# Patient Record
Sex: Male | Born: 2000 | Race: Black or African American | Hispanic: No | Marital: Single | Smoking: Never smoker
Health system: Southern US, Community
[De-identification: ages and names within clinical notes are randomized; demographics above are authoritative.]

## PROBLEM LIST (undated history)

## (undated) DIAGNOSIS — J020 Streptococcal pharyngitis: Secondary | ICD-10-CM

## (undated) DIAGNOSIS — J45909 Unspecified asthma, uncomplicated: Secondary | ICD-10-CM

## (undated) HISTORY — PX: SHOULDER SURGERY: SHX246

---
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
## 2019-02-25 DIAGNOSIS — Z20828 Contact with and (suspected) exposure to other viral communicable diseases: Secondary | ICD-10-CM | POA: Diagnosis not present

## 2020-05-31 ENCOUNTER — Emergency Department (HOSPITAL_BASED_OUTPATIENT_CLINIC_OR_DEPARTMENT_OTHER): Payer: BC Managed Care – PPO

## 2020-05-31 ENCOUNTER — Encounter (HOSPITAL_BASED_OUTPATIENT_CLINIC_OR_DEPARTMENT_OTHER): Payer: Self-pay | Admitting: Emergency Medicine

## 2020-05-31 ENCOUNTER — Emergency Department (HOSPITAL_BASED_OUTPATIENT_CLINIC_OR_DEPARTMENT_OTHER)
Admission: EM | Admit: 2020-05-31 | Discharge: 2020-05-31 | Disposition: A | Discharge status: Home / Self Care | Payer: BC Managed Care – PPO | Attending: Emergency Medicine | Admitting: Emergency Medicine

## 2020-05-31 ENCOUNTER — Other Ambulatory Visit: Payer: Self-pay

## 2020-05-31 DIAGNOSIS — Z20822 Contact with and (suspected) exposure to covid-19: Secondary | ICD-10-CM

## 2020-05-31 DIAGNOSIS — R0602 Shortness of breath: Secondary | ICD-10-CM | POA: Diagnosis not present

## 2020-05-31 DIAGNOSIS — J45909 Unspecified asthma, uncomplicated: Secondary | ICD-10-CM | POA: Insufficient documentation

## 2020-05-31 DIAGNOSIS — U071 COVID-19: Secondary | ICD-10-CM | POA: Insufficient documentation

## 2020-05-31 DIAGNOSIS — R059 Cough, unspecified: Secondary | ICD-10-CM | POA: Diagnosis not present

## 2020-05-31 DIAGNOSIS — J90 Pleural effusion, not elsewhere classified: Secondary | ICD-10-CM | POA: Diagnosis not present

## 2020-05-31 HISTORY — DX: Unspecified asthma, uncomplicated: J45.909

## 2020-05-31 LAB — RESP PANEL BY RT-PCR (FLU A&B, COVID) ARPGX2
Influenza A by PCR: NEGATIVE
Influenza B by PCR: NEGATIVE
SARS Coronavirus 2 by RT PCR: POSITIVE — AB

## 2020-05-31 MED ORDER — BENZONATATE 100 MG PO CAPS: 100.0000 mg | ORAL_CAPSULE | Freq: Three times a day (TID) | ORAL | 0 refills | Status: AC

## 2020-05-31 MED ORDER — ALBUTEROL SULFATE HFA 108 (90 BASE) MCG/ACT IN AERS
2.0000 | INHALATION_SPRAY | Freq: Once | RESPIRATORY_TRACT | Status: AC
  Administered 2020-05-31: 2 via RESPIRATORY_TRACT
  Filled 2020-05-31: qty 6.7

## 2020-05-31 NOTE — ED Provider Notes (Signed)
[facility] EMERGENCY DEPARTMENT Provider Note   CSN: [id number] Arrival date & time: 05/31/20  1025     History Chief Complaint  Patient presents with  . Cough    Jonathan Harris is a 19 y.o. male with PMHx asthma who presents to the ED today with complaint of dry cough that began last night. Pt also complains of shortness of breath, headache, and diarrhea. He states he was around his father 2 days ago prior to his father testing positive for COVID. Pt is unvaccinated. Pt has not taken anything for his symptoms. He denies fevers, chills, chest pain, abdominal pain, nausea, vomiting, sore throat, eat pain, or any other associated symptoms.   The history is provided by the patient and medical records.       Past Medical History:  Diagnosis Date  . Asthma     There are no problems to display for this patient.   Past Surgical History:  Procedure Laterality Date  . SHOULDER SURGERY Left        No family history on file.  Social History   Tobacco Use  . Smoking status: Never Smoker  . Smokeless tobacco: Never Used  Substance Use Topics  . Alcohol use: Never  . Drug use: Never    Home Medications Prior to Admission medications   Medication Sig Start Date End Date Taking? Authorizing Provider  benzonatate (TESSALON) 100 MG capsule Take 1 capsule (100 mg total) by mouth every 8 (eight) hours. 05/31/20   Tanda Rockers, PA-C    Allergies    Patient has no known allergies.  Review of Systems   Review of Systems  Constitutional: Negative for chills and fever.  HENT: Negative for sore throat.   Respiratory: Positive for cough and shortness of breath.   Cardiovascular: Negative for chest pain.  Gastrointestinal: Positive for diarrhea. Negative for abdominal pain, nausea and vomiting.  Neurological: Positive for headaches.    Physical Exam Updated Vital Signs BP 126/75 (BP Location: Right Arm)   Pulse 60   Temp 98.5 F (36.9 C) (Oral)   Resp 18    Ht 5\' 10"  (1.778 m)   Wt 70.3 kg   SpO2 100%   BMI 22.24 kg/m   Physical Exam Vitals and nursing note reviewed.  Constitutional:      Appearance: He is not ill-appearing or diaphoretic.  HENT:     Head: Normocephalic and atraumatic.  Eyes:     Conjunctiva/sclera: Conjunctivae normal.  Cardiovascular:     Rate and Rhythm: Normal rate and regular rhythm.  Pulmonary:     Effort: Pulmonary effort is normal.     Breath sounds: Normal breath sounds. No wheezing, rhonchi or rales.     Comments: Actively coughing in the room however able to speak in full sentences without difficulty. Satting 100% on RA at rest. Pt personally ambulated by myself with O2 sats remaining at 98%. LCTAB. Abdominal:     Tenderness: There is no abdominal tenderness. There is no guarding or rebound.  Skin:    General: Skin is warm and dry.     Coloration: Skin is not jaundiced.  Neurological:     Mental Status: He is alert.     ED Results / Procedures / Treatments   Labs (all labs ordered are listed, but only abnormal results are displayed) Labs Reviewed  RESP PANEL BY RT-PCR (FLU A&B, COVID) ARPGX2    EKG EKG Interpretation  Date/Time:  Thursday May 31 2020 10:38:35 EST Ventricular Rate:  61 PR Interval:    QRS Duration: 95 QT Interval:  375 QTC Calculation: 378 R Axis:   67 Text Interpretation: Sinus rhythm No prior ECG for comparison. No STEMI Confirmed by Theda Belfast ([id number]) on 05/31/2020 10:42:43 AM   Radiology DG Chest Port 1 View  Result Date: 05/31/2020 CLINICAL DATA:  Cough EXAM: PORTABLE CHEST 1 VIEW COMPARISON:  None. FINDINGS: The cardiomediastinal silhouette is normal in contour. No pleural effusion. No pneumothorax. No acute pleuroparenchymal abnormality. Visualized abdomen is unremarkable. No acute osseous abnormality noted. IMPRESSION: No acute cardiopulmonary abnormality. Electronically Signed   By: Meda Klinefelter MD   On: 05/31/2020 11:33     Procedures Procedures (including critical care time)  Medications Ordered in ED Medications  albuterol (VENTOLIN HFA) 108 (90 Base) MCG/ACT inhaler 2 puff (2 puffs Inhalation Given 05/31/20 1125)    ED Course  I have reviewed the triage vital signs and the nursing notes.  Pertinent labs & imaging results that were available during my care of the patient were reviewed by me and considered in my medical decision making (see chart for details).    MDM Rules/Calculators/A&P                          19 year old male presenting to the ED today with complaint of cough, SOB, headache, diarrhea that began last night after recent positive exposure to COVID positive patient. Pt is unvaccinated. On arrival to the ED VSS. Pt is afebrile, nontachycardic, and nontachypneic. He appears to be in NAD. An EKG was obtained prior to being seen at pt complains of SOB however on my exam he is able to speak in full sentences without difficulty, satting 100% on RA at rest and 98% on RA at ambulation. EKG without acute ischemic changes. Will plan for CXR and COVID swab at this time. Will provide albuterol inhaler for patient for symptomatic treatment. If CXR negative will plan to discharge and have pt await his covid test results however I have a strong suspicion they will be positive.   CXR clear. Will discharge home at this time and have pt await results. He is in agreement with plan today and stable for discharge.   This note was prepared using Dragon voice recognition software and may include unintentional dictation errors due to the inherent limitations of voice recognition software.  [name] was evaluated in Emergency Department on 05/31/2020 for the symptoms described in the history of present illness. He was evaluated in the context of the global COVID-19 pandemic, which necessitated consideration that the patient might be at risk for infection with the SARS-CoV-2 virus that causes COVID-19.  Institutional protocols and algorithms that pertain to the evaluation of patients at risk for COVID-19 are in a state of rapid change based on information released by regulatory bodies including the CDC and federal and state organizations. These policies and algorithms were followed during the patient's care in the ED.   Final Clinical Impression(s) / ED Diagnoses Final diagnoses:  Person under investigation for COVID-19  Cough    Rx / DC Orders ED Discharge Orders         Ordered    benzonatate (TESSALON) 100 MG capsule  Every 8 hours        05/31/20 1140           Discharge Instructions     Your chest xray did not show any signs of infection today. We have swabbed you for  COVID - please await your results. We will call you if you test positive. If positive you will need to self isolate for 14 days (cleared: 06/15/2020). If negative I would still recommend you self isolate given your recent positive exposure with your father.   Drink plenty of water to stay hydrated. Take Tylenol as needed for fevers > 100.4, Take Ibuprofen as needed for headaches. Use the albuterol inhaler as needed for shortness of breath. I have also prescribed cough medication for you to take as prescribed.   Follow up with your PCP regarding your ED visit today  Return to the ED IMMEDIATELY for any worsening symptoms including worsening shortness of breath, passing out, severe chest pain, fingers/lips turning blue, or any other new/concerning symptoms.        Tanda Rockers, PA-C 05/31/20 1145    Tegeler, Canary Brim, MD 05/31/20 [phone]

## 2020-05-31 NOTE — ED Notes (Signed)
ED Provider at bedside. 

## 2020-05-31 NOTE — ED Triage Notes (Signed)
States," Since last night I have had cough, SOB, h/a" Father dx'ed with COVID x 2 days ago

## 2020-05-31 NOTE — Discharge Instructions (Addendum)
Your chest xray did not show any signs of infection today. We have swabbed you for COVID - please await your results. We will call you if you test positive. If positive you will need to self isolate for 14 days (cleared: 06/15/2020). If negative I would still recommend you self isolate given your recent positive exposure with your father.   Drink plenty of water to stay hydrated. Take Tylenol as needed for fevers > 100.4, Take Ibuprofen as needed for headaches. Use the albuterol inhaler as needed for shortness of breath. I have also prescribed cough medication for you to take as prescribed.   Follow up with your PCP regarding your ED visit today  Return to the ED IMMEDIATELY for any worsening symptoms including worsening shortness of breath, passing out, severe chest pain, fingers/lips turning blue, or any other new/concerning symptoms.

## 2020-06-01 ENCOUNTER — Telehealth: Payer: Self-pay | Admitting: Physician Assistant

## 2020-06-01 NOTE — Telephone Encounter (Signed)
Called to discuss with patient about Covid symptoms and the use of sotrovimab, bamlanivimab/etesevimab or casirivimab/imdevimab, a monoclonal antibody infusion for those with mild to moderate Covid symptoms and at a high risk of hospitalization.  Pt is qualified for this infusion at the [facility] due to; Specific high risk criteria : Other high risk medical condition per CDC:  increased SVI   His primary listed number was answered by another woman who said I had the wrong number and hung up .  Cline Crock PA-C

## 2021-05-06 ENCOUNTER — Other Ambulatory Visit: Payer: Self-pay

## 2021-05-06 ENCOUNTER — Emergency Department (HOSPITAL_BASED_OUTPATIENT_CLINIC_OR_DEPARTMENT_OTHER)
Admission: EM | Admit: 2021-05-06 | Discharge: 2021-05-06 | Disposition: A | Discharge status: Home / Self Care | Payer: BC Managed Care – PPO | Attending: Emergency Medicine | Admitting: Emergency Medicine

## 2021-05-06 DIAGNOSIS — J069 Acute upper respiratory infection, unspecified: Secondary | ICD-10-CM | POA: Insufficient documentation

## 2021-05-06 DIAGNOSIS — B9789 Other viral agents as the cause of diseases classified elsewhere: Secondary | ICD-10-CM | POA: Diagnosis not present

## 2021-05-06 DIAGNOSIS — Z20822 Contact with and (suspected) exposure to covid-19: Secondary | ICD-10-CM | POA: Insufficient documentation

## 2021-05-06 DIAGNOSIS — J45909 Unspecified asthma, uncomplicated: Secondary | ICD-10-CM | POA: Insufficient documentation

## 2021-05-06 DIAGNOSIS — R0981 Nasal congestion: Secondary | ICD-10-CM | POA: Diagnosis not present

## 2021-05-06 LAB — RESP PANEL BY RT-PCR (FLU A&B, COVID) ARPGX2
Influenza A by PCR: NEGATIVE
Influenza B by PCR: NEGATIVE
SARS Coronavirus 2 by RT PCR: NEGATIVE

## 2021-05-06 NOTE — ED Provider Notes (Signed)
[facility] EMERGENCY DEPARTMENT Provider Note   CSN: [id number] Arrival date & time: 05/06/21  1405     History Chief Complaint  Patient presents with   Nasal Congestion    Jonathan Harris is a 20 y.o. male presenting today requesting an RSV test.  He was around his little cousin who tested positive for RSV last week.  Over the weekend he began to have fevers, chills and congestion.  Denies any difficulty breathing or chest pain.  Has been using Mucinex over-the-counter which he feels has helped some.   Past Medical History:  Diagnosis Date   Asthma     There are no problems to display for this patient.   Past Surgical History:  Procedure Laterality Date   SHOULDER SURGERY Left        No family history on file.  Social History   Tobacco Use   Smoking status: Never   Smokeless tobacco: Never  Substance Use Topics   Alcohol use: Never   Drug use: Never    Home Medications Prior to Admission medications   Medication Sig Start Date End Date Taking? Authorizing Provider  benzonatate (TESSALON) 100 MG capsule Take 1 capsule (100 mg total) by mouth every 8 (eight) hours. 05/31/20   Tanda Rockers, PA-C    Allergies    Patient has no known allergies.  Review of Systems   Review of Systems  HENT:  Positive for congestion.   Musculoskeletal:  Positive for myalgias.  Neurological:  Positive for headaches.   Physical Exam Updated Vital Signs BP 96/64 (BP Location: Right Arm)   Pulse 74   Temp 98.2 F (36.8 C) (Oral)   Resp 18   Ht 5\' 10"  (1.778 m)   Wt 68 kg   SpO2 97%   BMI 21.52 kg/m   Physical Exam Vitals and nursing note reviewed.  Constitutional:      Appearance: Normal appearance.  HENT:     Head: Normocephalic and atraumatic.     Right Ear: Tympanic membrane normal.     Left Ear: Tympanic membrane normal.     Nose: Nose normal.     Mouth/Throat:     Mouth: Mucous membranes are moist.     Pharynx: Oropharynx is clear.  Eyes:      General: No scleral icterus.    Conjunctiva/sclera: Conjunctivae normal.  Cardiovascular:     Rate and Rhythm: Normal rate and regular rhythm.  Pulmonary:     Effort: Pulmonary effort is normal. No respiratory distress.     Breath sounds: No wheezing or rales.  Abdominal:     General: Abdomen is flat.     Palpations: Abdomen is soft.     Tenderness: There is no abdominal tenderness.  Musculoskeletal:     Cervical back: Normal range of motion.  Skin:    General: Skin is warm and dry.     Findings: No rash.  Neurological:     Mental Status: He is alert.  Psychiatric:        Mood and Affect: Mood normal.        Behavior: Behavior normal.    ED Results / Procedures / Treatments   Labs (all labs ordered are listed, but only abnormal results are displayed) Labs Reviewed  RESP PANEL BY RT-PCR (FLU A&B, COVID) ARPGX2    EKG None  Radiology No results found.  Procedures Procedures   Medications Ordered in ED Medications - No data to display  ED Course  I have reviewed  the triage vital signs and the nursing notes.  Pertinent labs & imaging results that were available during my care of the patient were reviewed by me and considered in my medical decision making (see chart for details).    MDM Rules/Calculators/A&P Patient was evaluated by me and fast-track.  He was resting comfortably in no acute distress.  We discussed that our test do not include RSV for adults however symptoms are similar with COVID and the flu.  These test will not come back today and he is okay to look for the results on his portal or be contacted by telephone.  Agreeable, ambulatory and stable for discharge at this time.  Final Clinical Impression(s) / ED Diagnoses Final diagnoses:  Viral upper respiratory tract infection    Rx / DC Orders Results and diagnoses were explained to the patient. Return precautions discussed in full. Patient had no additional questions and expressed complete  understanding.     Woodroe Chen 05/06/21 1654    Glendora Score, MD 05/06/21 [phone]

## 2021-05-06 NOTE — ED Triage Notes (Signed)
Pt reports congestion, headache, body aches for last several days. Pt reports intermittent fever with no relief of symptoms with otc mucinex. Pt reports niece tested positive for RSV recently and would like to be tested.

## 2021-05-06 NOTE — Discharge Instructions (Addendum)
Your COVID and flu testing will not come back today.  Please look out for these results in your chart or you can expect a phone call if you test positive.  It was a pleasure to meet you and I hope that you feel better!

## 2021-07-07 ENCOUNTER — Emergency Department (HOSPITAL_BASED_OUTPATIENT_CLINIC_OR_DEPARTMENT_OTHER)
Admission: EM | Admit: 2021-07-07 | Discharge: 2021-07-07 | Disposition: A | Discharge status: Home / Self Care | Payer: BC Managed Care – PPO | Attending: Emergency Medicine | Admitting: Emergency Medicine

## 2021-07-07 ENCOUNTER — Other Ambulatory Visit: Payer: Self-pay

## 2021-07-07 ENCOUNTER — Encounter (HOSPITAL_BASED_OUTPATIENT_CLINIC_OR_DEPARTMENT_OTHER): Payer: Self-pay | Admitting: Urology

## 2021-07-07 DIAGNOSIS — J029 Acute pharyngitis, unspecified: Secondary | ICD-10-CM | POA: Diagnosis present

## 2021-07-07 DIAGNOSIS — Z20822 Contact with and (suspected) exposure to covid-19: Secondary | ICD-10-CM | POA: Diagnosis not present

## 2021-07-07 LAB — RESP PANEL BY RT-PCR (FLU A&B, COVID) ARPGX2
Influenza A by PCR: NEGATIVE
Influenza B by PCR: NEGATIVE
SARS Coronavirus 2 by RT PCR: NEGATIVE

## 2021-07-07 LAB — GROUP A STREP BY PCR: Group A Strep by PCR: NOT DETECTED

## 2021-07-07 MED ORDER — CETIRIZINE HCL 10 MG PO TABS: 10.0000 mg | ORAL_TABLET | Freq: Every day | ORAL | 0 refills | Status: AC

## 2021-07-07 MED ORDER — ACETAMINOPHEN 325 MG PO TABS
650.0000 mg | ORAL_TABLET | Freq: Once | ORAL | Status: AC
  Administered 2021-07-07: 650 mg via ORAL
  Filled 2021-07-07 (×2): qty 2

## 2021-07-07 MED ORDER — FLUTICASONE PROPIONATE 50 MCG/ACT NA SUSP: 2.0000 | Freq: Every day | NASAL | 0 refills | Status: AC

## 2021-07-07 NOTE — ED Triage Notes (Signed)
Sore throat since Tuesday, states eye redness x 2 days Denies Fever/N/V

## 2021-07-07 NOTE — Discharge Instructions (Addendum)
Your testing was negative for COVID, influenza and strep Your symptoms are likely viral in origin.  Please drink plenty of water, warm tea and honey, take Tylenol and ibuprofen as discussed below.  Please use Tylenol or ibuprofen for pain.  You may use 600 mg ibuprofen every 6 hours or 1000 mg of Tylenol every 6 hours.  You may choose to alternate between the 2.  This would be most effective.  Not to exceed 4 g of Tylenol within 24 hours.  Not to exceed 3200 mg ibuprofen 24 hours.   Viral Illness TREATMENT  Treatment is directed at relieving symptoms. There is no cure. Antibiotics are not effective, because the infection is caused by a virus, not by bacteria. Treatment may include:  Increased fluid intake. Sports drinks offer valuable electrolytes, sugars, and fluids.  Breathing heated mist or steam (vaporizer or shower).  Eating chicken soup or other clear broths, and maintaining good nutrition.  Getting plenty of rest.  Using gargles or lozenges for comfort.  Increasing usage of your inhaler if you have asthma.  Return to work when your temperature has returned to normal.  Gargle warm salt water and spit it out for sore throat. Take benadryl to decrease sinus secretions. Continue to alternate between Tylenol and ibuprofen for pain and fever control.  Follow Up: Follow up with your primary care doctor in 5-7 days for recheck of ongoing symptoms.  Return to emergency department for emergent changing or worsening of symptoms.

## 2021-07-07 NOTE — ED Provider Notes (Signed)
[facility] EMERGENCY DEPARTMENT Provider Note   CSN: [id number] Arrival date & time: 07/07/21  2109     History  Chief Complaint  Patient presents with   Sore Throat    Jonathan Harris is a 21 y.o. male.   Sore Throat Pertinent negatives include no chest pain, no headaches and no shortness of breath.  Patient is a 21 year old male   He is presented emergency room today with 2 days of fatigue, sore throat, cough congestion no other significant associated symptoms.  States that his sore throat is mild and achy has not take any medications for it.  He has not had any sick contacts that he knows of.  No chest pain or difficulty breathing.  No aggravating or mitigating factors.     Home Medications Prior to Admission medications   Medication Sig Start Date End Date Taking? Authorizing Provider  cetirizine (ZYRTEC ALLERGY) 10 MG tablet Take 1 tablet (10 mg total) by mouth daily. 07/07/21  Yes Benecio Kluger S, PA  fluticasone (FLONASE) 50 MCG/ACT nasal spray Place 2 sprays into both nostrils daily for 14 days. 07/07/21 07/21/21 Yes Genavie Boettger S, PA  benzonatate (TESSALON) 100 MG capsule Take 1 capsule (100 mg total) by mouth every 8 (eight) hours. 05/31/20   Tanda Rockers, PA-C      Allergies    Patient has no known allergies.    Review of Systems   Review of Systems  Constitutional:  Negative for fever.  HENT:  Positive for congestion and sore throat.   Respiratory:  Positive for cough. Negative for shortness of breath.   Cardiovascular:  Negative for chest pain.  Gastrointestinal:  Negative for abdominal distention.  Neurological:  Negative for dizziness and headaches.   Physical Exam Updated Vital Signs BP 106/77 (BP Location: Right Arm)    Pulse (!) 107    Temp 99.2 F (37.3 C) (Oral)    Resp 18    Ht 5\' 10"  (1.778 m)    Wt 70.3 kg    SpO2 99%    BMI 22.24 kg/m  Physical Exam Vitals and nursing note reviewed.  Constitutional:      General: He is not  in acute distress. HENT:     Head: Normocephalic and atraumatic.     Nose: Nose normal.     Mouth/Throat:     Comments: Uvula midline, normal phonation, no tonsillar hypertrophy or exudates Eyes:     General: No scleral icterus. Cardiovascular:     Rate and Rhythm: Normal rate and regular rhythm.     Pulses: Normal pulses.     Heart sounds: Normal heart sounds.  Pulmonary:     Effort: Pulmonary effort is normal. No respiratory distress.     Breath sounds: No wheezing.     Comments: No tachypnea, speaking full sentences, lungs are clear Abdominal:     Palpations: Abdomen is soft.     Tenderness: There is no abdominal tenderness.  Musculoskeletal:     Cervical back: Normal range of motion.     Right lower leg: No edema.     Left lower leg: No edema.  Skin:    General: Skin is warm and dry.     Capillary Refill: Capillary refill takes less than 2 seconds.  Neurological:     Mental Status: He is alert. Mental status is at baseline.  Psychiatric:        Mood and Affect: Mood normal.        Behavior: Behavior normal.  ED Results / Procedures / Treatments   Labs (all labs ordered are listed, but only abnormal results are displayed) Labs Reviewed  GROUP A STREP BY PCR  RESP PANEL BY RT-PCR (FLU A&B, COVID) ARPGX2    EKG None  Radiology No results found.  Procedures Procedures    Medications Ordered in ED Medications  acetaminophen (TYLENOL) tablet 650 mg (650 mg Oral Given 07/07/21 2220)    ED Course/ Medical Decision Making/ A&P                           COVID/Flu and strep negative  Well appearing 21 year old male with past medical history that is not significant per patient.  Patient is presented to the emergency room today with sore throat for 2 days.  No systemic symptoms such as fevers nausea vomiting significant malaise.  No nausea vomiting diarrhea  Physical exam reassuring.  No evidence of peritonsillar or retropharyngeal abscess.  History  physical exam not concerning for [place]. Overall well appearing. Believe pt will benefit from conservative therapy and follow-up with PCP.  Prescribed Flonase and Zyrtec daily has some allergy-like symptoms/congestion.  Final Clinical Impression(s) / ED Diagnoses Final diagnoses:  Viral pharyngitis    Rx / DC Orders ED Discharge Orders          Ordered    fluticasone (FLONASE) 50 MCG/ACT nasal spray  Daily        07/07/21 2323    cetirizine (ZYRTEC ALLERGY) 10 MG tablet  Daily        07/07/21 2323              Solon Augusta [place], [place] 07/11/21 4235    Gloris Manchester, MD 07/12/21 1754

## 2022-02-22 ENCOUNTER — Emergency Department (HOSPITAL_BASED_OUTPATIENT_CLINIC_OR_DEPARTMENT_OTHER): Payer: BC Managed Care – PPO

## 2022-02-22 ENCOUNTER — Emergency Department (HOSPITAL_BASED_OUTPATIENT_CLINIC_OR_DEPARTMENT_OTHER)
Admission: EM | Admit: 2022-02-22 | Discharge: 2022-02-22 | Disposition: A | Discharge status: Home / Self Care | Payer: BC Managed Care – PPO | Attending: Emergency Medicine | Admitting: Emergency Medicine

## 2022-02-22 ENCOUNTER — Encounter (HOSPITAL_BASED_OUTPATIENT_CLINIC_OR_DEPARTMENT_OTHER): Payer: Self-pay | Admitting: Emergency Medicine

## 2022-02-22 DIAGNOSIS — W06XXXA Fall from bed, initial encounter: Secondary | ICD-10-CM | POA: Insufficient documentation

## 2022-02-22 DIAGNOSIS — M24411 Recurrent dislocation, right shoulder: Secondary | ICD-10-CM | POA: Insufficient documentation

## 2022-02-22 DIAGNOSIS — S4991XA Unspecified injury of right shoulder and upper arm, initial encounter: Secondary | ICD-10-CM | POA: Diagnosis present

## 2022-02-22 MED ORDER — DICLOFENAC SODIUM ER 100 MG PO TB24: 100.0000 mg | ORAL_TABLET | Freq: Every day | ORAL | 0 refills | Status: AC

## 2022-02-22 MED ORDER — PROPOFOL 10 MG/ML IV BOLUS
0.5000 mg/kg | Freq: Once | INTRAVENOUS | Status: AC
  Administered 2022-02-22: 35.2 mg via INTRAVENOUS
  Filled 2022-02-22: qty 20

## 2022-02-22 MED ORDER — KETOROLAC TROMETHAMINE 30 MG/ML IJ SOLN
30.0000 mg | Freq: Once | INTRAMUSCULAR | Status: AC
  Administered 2022-02-22: 30 mg via INTRAVENOUS
  Filled 2022-02-22: qty 1

## 2022-02-22 MED ORDER — ONDANSETRON HCL 4 MG/2ML IJ SOLN
4.0000 mg | Freq: Once | INTRAMUSCULAR | Status: AC
  Administered 2022-02-22: 4 mg via INTRAVENOUS
  Filled 2022-02-22: qty 2

## 2022-02-22 NOTE — ED Triage Notes (Signed)
Pt states he woke up about an hour ago and his right arm felt numb then he tried to sit up and had pain in his right shoulder  Pt has hx of dislocation  CMS checks wnl

## 2022-02-22 NOTE — ED Notes (Signed)
Patient transported to X-ray 

## 2022-02-22 NOTE — ED Provider Notes (Signed)
[facility] EMERGENCY DEPARTMENT Provider Note   CSN: [id number] Arrival date & time: 02/22/22  0324     History  Chief Complaint  Patient presents with   Shoulder Pain    Jonathan Harris is a 21 y.o. male.  The history is provided by the patient.  Shoulder Pain Location:  Shoulder Shoulder location:  R shoulder Upper extremity injury: rolled over in bed.  has dislocated shoulder previously.   Pain details:    Radiates to:  Does not radiate   Severity:  Severe   Onset quality:  Gradual   Duration:  1 hour   Timing:  Constant   Progression:  Unchanged Dislocation: yes   Relieved by:  Nothing Worsened by:  Nothing Ineffective treatments:  None tried Associated symptoms: decreased range of motion   Associated symptoms: no fever   Risk factors: no known bone disorder and no recent illness   Last meal at 10 pm [place].  Last drink propel.       Home Medications Prior to Admission medications   Medication Sig Start Date End Date Taking? Authorizing Provider  benzonatate (TESSALON) 100 MG capsule Take 1 capsule (100 mg total) by mouth every 8 (eight) hours. 05/31/20   Tanda Rockers, PA-C  cetirizine (ZYRTEC ALLERGY) 10 MG tablet Take 1 tablet (10 mg total) by mouth daily. 07/07/21   Fondaw, Rodrigo Ran, PA  fluticasone (FLONASE) 50 MCG/ACT nasal spray Place 2 sprays into both nostrils daily for 14 days. 07/07/21 07/21/21  Gailen Shelter, PA      Allergies    Patient has no known allergies.    Review of Systems   Review of Systems  Constitutional:  Negative for fever.  HENT:  Negative for facial swelling.   Musculoskeletal:  Positive for arthralgias.  All other systems reviewed and are negative.   Physical Exam Updated Vital Signs BP 114/81   Pulse (!) 41   Temp 97.7 F (36.5 C) (Oral)   Resp 19   Ht 6' (1.829 m)   Wt 70.3 kg   SpO2 100%   BMI 21.02 kg/m  Physical Exam Vitals and nursing note reviewed. Exam conducted with a chaperone present.   Constitutional:      General: He is not in acute distress.    Appearance: He is well-developed. He is not diaphoretic.  HENT:     Head: Normocephalic and atraumatic.  Eyes:     Conjunctiva/sclera: Conjunctivae normal.     Pupils: Pupils are equal, round, and reactive to light.  Cardiovascular:     Rate and Rhythm: Normal rate and regular rhythm.  Pulmonary:     Effort: Pulmonary effort is normal.     Breath sounds: Normal breath sounds. No wheezing or rales.  Abdominal:     General: Abdomen is flat. Bowel sounds are normal.     Palpations: Abdomen is soft.     Tenderness: There is no abdominal tenderness. There is no guarding or rebound.  Musculoskeletal:     Right shoulder: Deformity present.     Cervical back: Normal range of motion and neck supple.  Skin:    General: Skin is warm and dry.  Neurological:     Mental Status: He is alert and oriented to person, place, and time.     ED Results / Procedures / Treatments   Labs (all labs ordered are listed, but only abnormal results are displayed) Labs Reviewed - No data to display  EKG None  Radiology DG Shoulder Right  Result Date: 02/22/2022 CLINICAL DATA:  21 year old male with acute right shoulder pain. EXAM: RIGHT SHOULDER - 2+ VIEW COMPARISON:  Portable chest 05/31/2020. FINDINGS: Anterior, subcoracoid glenohumeral dislocation. No fracture identified. Bone mineralization is within normal limits. Negative visible right ribs and chest. IMPRESSION: Anterior right glenohumeral dislocation.  No fracture identified. Electronically Signed   By: Odessa Fleming M.D.   On: 02/22/2022 04:21    Procedures .Ortho Injury Treatment  Date/Time: 02/22/2022 5:22 AM  Performed by: Cy Blamer, MD Authorized by: Cy Blamer, MD   Consent:    Consent obtained:  Written   Consent given by:  Patient   Risks discussed:  Recurrent dislocation   Alternatives discussed:  ReferralInjury location: shoulder Location details: right  shoulder Injury type: dislocation Dislocation type: anterior Chronicity: recurrent Pre-procedure distal perfusion: normal Pre-procedure neurological function: normal Pre-procedure range of motion: reduced  Anesthesia: Local anesthesia used: no  Patient sedated: Yes. Refer to sedation procedure documentation for details of sedation. Manipulation performed: yes Reduction method: external rotation Reduction successful: yes X-ray confirmed reduction: yes Immobilization: sling Splint type: shoulder immobilizer. Splint Applied by: ED Nurse and ED Provider Post-procedure neurovascular assessment: post-procedure neurovascularly intact Post-procedure distal perfusion: normal Post-procedure neurological function: normal Post-procedure range of motion: normal   .Sedation  Date/Time: 02/22/2022 5:23 AM  Performed by: Cy Blamer, MD Authorized by: Cy Blamer, MD   Consent:    Consent obtained:  Written   Consent given by:  Patient   Risks discussed:  Allergic reaction, dysrhythmia and inadequate sedation Universal protocol:    Procedure explained and questions answered to patient or proxy's satisfaction: yes     Relevant documents present and verified: yes     Test results available: yes     Imaging studies available: yes     Immediately prior to procedure, a time out was called: yes     Patient identity confirmed:  Arm band and verbally with patient Indications:    Procedure performed:  Dislocation reduction   Procedure necessitating sedation performed by:  Physician performing sedation Pre-sedation assessment:    Time since last food or drink:  6   ASA classification: class 1 - normal, healthy patient     Mouth opening:  3 or more finger widths   Thyromental distance:  4 finger widths   Mallampati score:  I - soft palate, uvula, fauces, pillars visible   Neck mobility: normal     Pre-sedation assessments completed and reviewed: pre-procedure airway patency not  reviewed, pre-procedure cardiovascular function not reviewed, pre-procedure hydration status not reviewed, pre-procedure mental status not reviewed, pre-procedure nausea and vomiting status not reviewed, pre-procedure pain level not reviewed, pre-procedure respiratory function not reviewed and pre-procedure temperature not reviewed     Pre-sedation assessment completed:  02/22/2022 4:54 AM Immediate pre-procedure details:    Reassessment: Patient reassessed immediately prior to procedure     Reviewed: vital signs and NPO status     Verified: bag valve mask available, intubation equipment available, IV patency confirmed and oxygen available   Procedure details (see MAR for exact dosages):    Preoxygenation:  Nasal cannula   Sedation:  Propofol   Intended level of sedation: deep and moderate (conscious sedation)   Intra-procedure events: none     Total Provider sedation time (minutes):  10 Post-procedure details:    Post-sedation assessment completed:  02/22/2022 5:26 AM   Attendance: Constant attendance by certified staff until patient recovered     Recovery: Patient returned to pre-procedure baseline  Post-sedation assessments completed and reviewed: post-procedure airway patency not reviewed, post-procedure cardiovascular function not reviewed, post-procedure hydration status not reviewed, post-procedure mental status not reviewed, post-procedure nausea and vomiting status not reviewed, pain score not reviewed, post-procedure respiratory function not reviewed and post-procedure temperature not reviewed     Patient is stable for discharge or admission: yes     Procedure completion:  Tolerated well, no immediate complications     Medications Ordered in ED Medications  ondansetron (ZOFRAN) injection 4 mg (4 mg Intravenous Given 02/22/22 0346)  ketorolac (TORADOL) 30 MG/ML injection 30 mg (30 mg Intravenous Given 02/22/22 0346)  propofol (DIPRIVAN) 10 mg/mL bolus/IV push 35.2 mg (35.2 mg  Intravenous Given 02/22/22 0457)    ED Course/ Medical Decision Making/ A&P                           Medical Decision Making Patient with recurrent shoulder dislocation   Amount and/or Complexity of Data Reviewed External Data Reviewed: notes.    Details: previous notes reviewed Radiology: ordered and independent interpretation performed.    Details: first Xray anterior dislocation by me 2nd relocated shoulder by me  Risk Prescription drug management. Risk Details: Shoulder immobilizer placed.  Will start voltaren Xr.  Follow up with orthopedics for ongoing care.  Wear immobilizer until cleared by orthopedics.  Strict return.  Dad present to take patient home.      Final Clinical Impression(s) / ED Diagnoses Final diagnoses:  Shoulder dislocation, recurrent, right   Return for intractable cough, coughing up blood, fevers > 100.4 unrelieved by medication, shortness of breath, intractable vomiting, chest pain, shortness of breath, weakness, numbness, changes in speech, facial asymmetry, abdominal pain, passing out, Inability to tolerate liquids or food, cough, altered mental status or any concerns. No signs of systemic illness or infection. The patient is nontoxic-appearing on exam and vital signs are within normal limits.  I have reviewed the triage vital signs and the nursing notes. Pertinent labs & imaging results that were available during my care of the patient were reviewed by me and considered in my medical decision making (see chart for details). After history, exam, and medical workup I feel the patient has been appropriately medically screened and is safe for discharge home. Pertinent diagnoses were discussed with the patient. Patient was given return precautions.  Rx / DC Orders ED Discharge Orders     None         Creedence Heiss, MD 02/22/22 IN:[id number]

## 2022-05-25 ENCOUNTER — Emergency Department (HOSPITAL_BASED_OUTPATIENT_CLINIC_OR_DEPARTMENT_OTHER)
Admission: EM | Admit: 2022-05-25 | Discharge: 2022-05-25 | Disposition: A | Discharge status: Home / Self Care | Payer: BC Managed Care – PPO | Attending: Emergency Medicine | Admitting: Emergency Medicine

## 2022-05-25 ENCOUNTER — Encounter (HOSPITAL_BASED_OUTPATIENT_CLINIC_OR_DEPARTMENT_OTHER): Payer: Self-pay | Admitting: Emergency Medicine

## 2022-05-25 ENCOUNTER — Other Ambulatory Visit: Payer: Self-pay

## 2022-05-25 ENCOUNTER — Telehealth (HOSPITAL_BASED_OUTPATIENT_CLINIC_OR_DEPARTMENT_OTHER): Payer: Self-pay | Admitting: Student

## 2022-05-25 DIAGNOSIS — J029 Acute pharyngitis, unspecified: Secondary | ICD-10-CM | POA: Diagnosis present

## 2022-05-25 DIAGNOSIS — Z1152 Encounter for screening for COVID-19: Secondary | ICD-10-CM | POA: Diagnosis not present

## 2022-05-25 DIAGNOSIS — J020 Streptococcal pharyngitis: Secondary | ICD-10-CM | POA: Insufficient documentation

## 2022-05-25 HISTORY — DX: Streptococcal pharyngitis: J02.0

## 2022-05-25 LAB — RESP PANEL BY RT-PCR (FLU A&B, COVID) ARPGX2
Influenza A by PCR: NEGATIVE
Influenza B by PCR: NEGATIVE
SARS Coronavirus 2 by RT PCR: NEGATIVE

## 2022-05-25 LAB — GROUP A STREP BY PCR: Group A Strep by PCR: DETECTED — AB

## 2022-05-25 MED ORDER — PENICILLIN V POTASSIUM 500 MG PO TABS: 500.0000 mg | ORAL_TABLET | Freq: Three times a day (TID) | ORAL | 0 refills | Status: AC

## 2022-05-25 NOTE — Discharge Instructions (Addendum)
You were diagnosed today with strep throat.  I have prescribed penicillin to treat the illness.  Please take the full course of antibiotics.  Please take over-the-counter medicine such as Advil and Tylenol as needed for fever and pain control.  If you become unable to swallow or develop other life-threatening conditions please return to the emergency department

## 2022-05-25 NOTE — ED Notes (Signed)
Pt endorses fever last night

## 2022-05-25 NOTE — ED Provider Notes (Signed)
[facility] EMERGENCY DEPARTMENT Provider Note   CSN: [id number] Arrival date & time: 05/25/22  1117     History  Chief Complaint  Patient presents with   Sore Throat    Jonathan Harris is a 21 y.o. male.  Patient presents to the emergency department complaining of a sore throat which began yesterday.  He endorses subjective fevers at home.  He has taken nothing at home pain.  Denies cough, abdominal pain, nausea, vomiting, shortness of breath.  Past medical history significant for asthma  HPI     Home Medications Prior to Admission medications   Medication Sig Start Date End Date Taking? Authorizing Provider  penicillin v potassium (VEETID) 500 MG tablet Take 1 tablet (500 mg total) by mouth 3 (three) times daily. 05/25/22  Yes Darrick Grinder, PA-C  benzonatate (TESSALON) 100 MG capsule Take 1 capsule (100 mg total) by mouth every 8 (eight) hours. 05/31/20   Tanda Rockers, PA-C  cetirizine (ZYRTEC ALLERGY) 10 MG tablet Take 1 tablet (10 mg total) by mouth daily. 07/07/21   Gailen Shelter, PA  Diclofenac Sodium CR 100 MG 24 hr tablet Take 1 tablet (100 mg total) by mouth daily. 02/22/22   Palumbo, April, MD  fluticasone (FLONASE) 50 MCG/ACT nasal spray Place 2 sprays into both nostrils daily for 14 days. 07/07/21 07/21/21  Gailen Shelter, PA      Allergies    Patient has no known allergies.    Review of Systems   Review of Systems  Constitutional:  Positive for fever.  HENT:  Positive for sore throat. Negative for sneezing.   Respiratory:  Negative for cough.   Gastrointestinal:  Negative for abdominal pain and nausea.    Physical Exam Updated Vital Signs BP 112/61   Pulse 96   Temp 99.5 F (37.5 C) (Oral)   Resp 16   Ht 6' (1.829 m)   Wt 72.6 kg   SpO2 98%   BMI 21.70 kg/m  Physical Exam Vitals and nursing note reviewed.  Constitutional:      General: He is not in acute distress.    Appearance: He is well-developed.  HENT:     Head:  Normocephalic and atraumatic.     Mouth/Throat:     Mouth: Mucous membranes are moist.     Pharynx: Uvula midline. Posterior oropharyngeal erythema present. No uvula swelling.     Tonsils: Tonsillar exudate present. No tonsillar abscesses. 0 on the right. 0 on the left.  Eyes:     Conjunctiva/sclera: Conjunctivae normal.  Cardiovascular:     Rate and Rhythm: Normal rate and regular rhythm.     Heart sounds: No murmur heard. Pulmonary:     Effort: Pulmonary effort is normal. No respiratory distress.     Breath sounds: Normal breath sounds.  Abdominal:     Palpations: Abdomen is soft.     Tenderness: There is no abdominal tenderness.  Musculoskeletal:        General: No swelling.     Cervical back: Neck supple.  Skin:    General: Skin is warm and dry.     Capillary Refill: Capillary refill takes less than 2 seconds.  Neurological:     Mental Status: He is alert.  Psychiatric:        Mood and Affect: Mood normal.     ED Results / Procedures / Treatments   Labs (all labs ordered are listed, but only abnormal results are displayed) Labs Reviewed  GROUP A STREP  BY PCR - Abnormal; Notable for the following components:      Result Value   Group A Strep by PCR DETECTED (*)    All other components within normal limits  RESP PANEL BY RT-PCR (FLU A&B, COVID) [id number]    EKG None  Radiology No results found.  Procedures Procedures    Medications Ordered in ED Medications - No data to display  ED Course/ Medical Decision Making/ A&P                           Medical Decision Making  Patient presents with chief complaint of sore throat.  Differential diagnosis includes limited to strep throat, and others  I reviewed the patient's past medical history and found no relevant visits  I ordered and reviewed labs.  Pertinent results include positive group A strep, negative COVID and influenza testing  Plan to discharge patient home with antibiotic prescription for strep  throat coverage.  Patient to take over-the-counter medication as needed for fever and pain control.  Patient voices understanding of plan.        Final Clinical Impression(s) / ED Diagnoses Final diagnoses:  Strep pharyngitis    Rx / DC Orders ED Discharge Orders          Ordered    penicillin v potassium (VEETID) 500 MG tablet  3 times daily        05/25/22 1256              Pamala Duffel 05/25/22 1257    Alvira Monday, MD 05/25/22 2104

## 2022-05-25 NOTE — Telephone Encounter (Signed)
Patient needs rx resetn

## 2022-05-25 NOTE — ED Triage Notes (Signed)
Pt c/o sore throat since yesterday 

## 2022-06-11 IMAGING — DX DG CHEST 1V PORT
1 series · 1 of 1 positions shown · non-contrast
Comparison: None.

CLINICAL DATA: Cough

EXAM:
PORTABLE CHEST 1 VIEW

[chest ap]
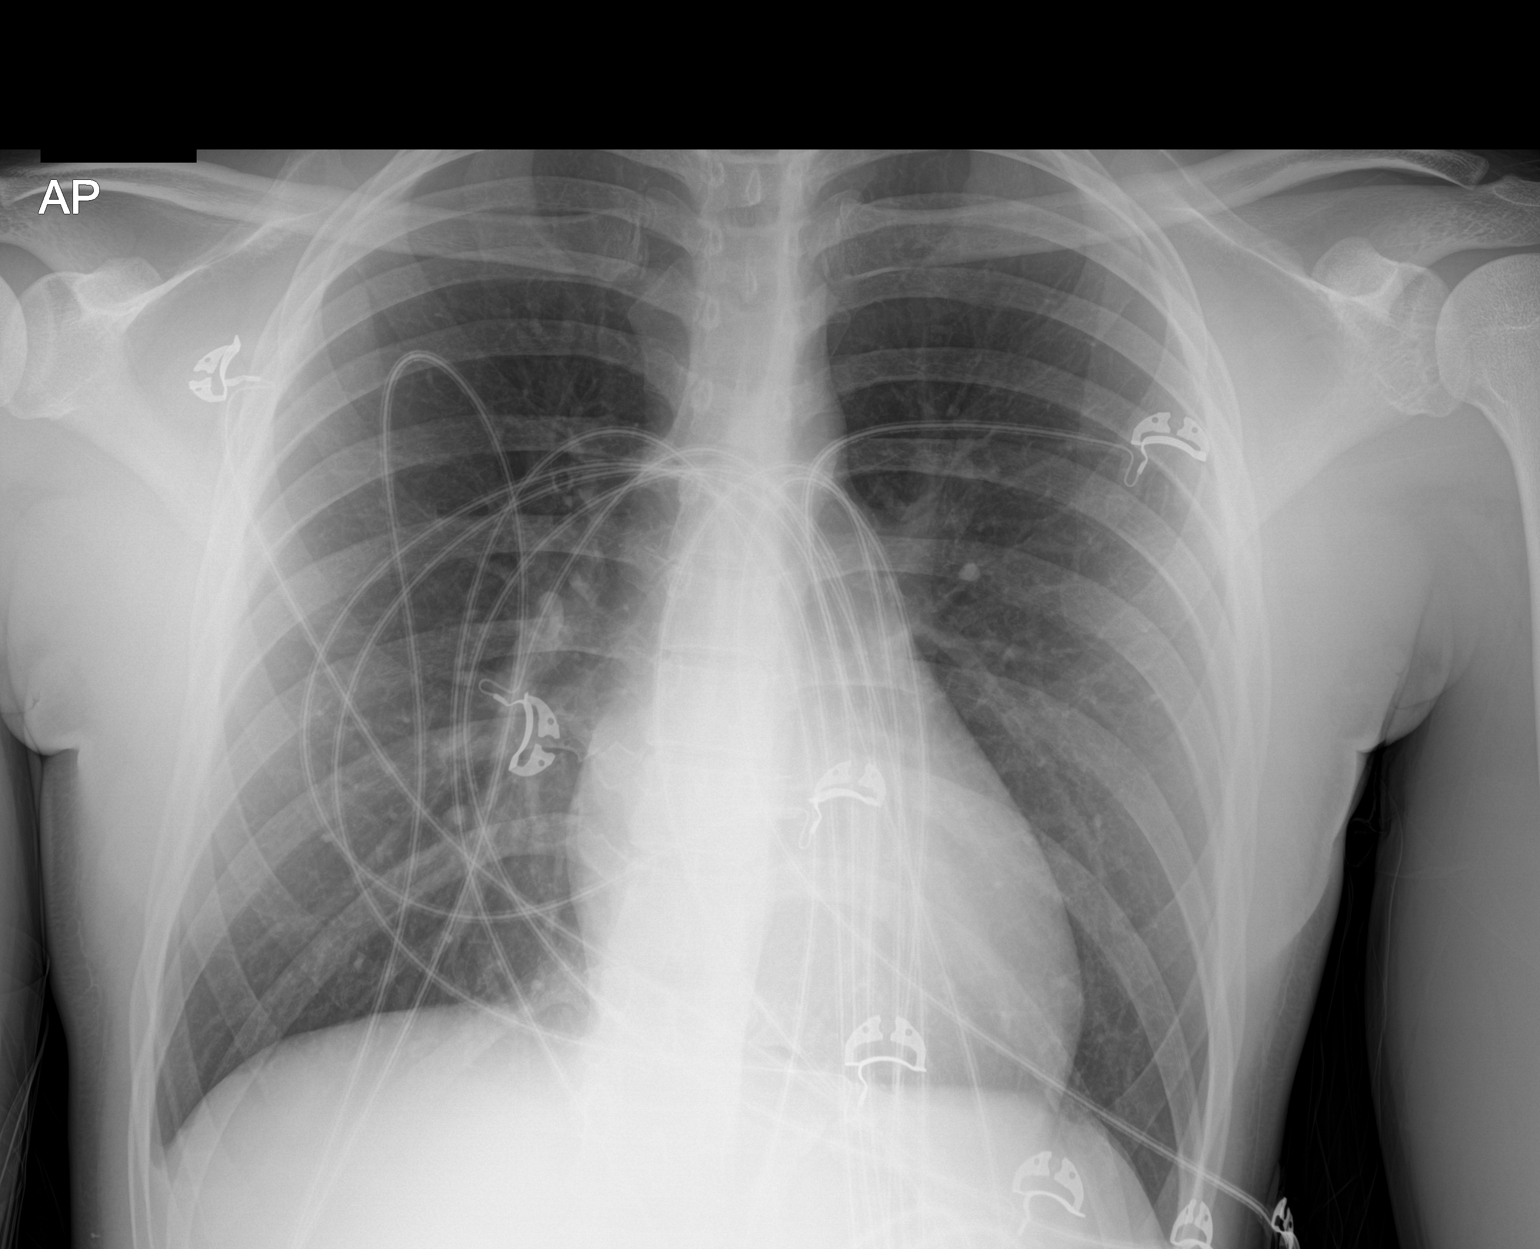

[1 of 1 positions shown; findings below may reference images not displayed]

FINDINGS: The cardiomediastinal silhouette is normal in contour. No pleural
effusion. No pneumothorax. No acute pleuroparenchymal abnormality.
Visualized abdomen is unremarkable. No acute osseous abnormality
noted.
IMPRESSION: No acute cardiopulmonary abnormality.

## 2022-09-04 ENCOUNTER — Encounter (HOSPITAL_BASED_OUTPATIENT_CLINIC_OR_DEPARTMENT_OTHER): Payer: Self-pay

## 2022-09-04 ENCOUNTER — Emergency Department (HOSPITAL_BASED_OUTPATIENT_CLINIC_OR_DEPARTMENT_OTHER): Payer: BC Managed Care – PPO

## 2022-09-04 ENCOUNTER — Emergency Department (HOSPITAL_BASED_OUTPATIENT_CLINIC_OR_DEPARTMENT_OTHER)
Admission: EM | Admit: 2022-09-04 | Discharge: 2022-09-04 | Disposition: A | Discharge status: Home / Self Care | Payer: BC Managed Care – PPO | Attending: Emergency Medicine | Admitting: Emergency Medicine

## 2022-09-04 ENCOUNTER — Other Ambulatory Visit: Payer: Self-pay

## 2022-09-04 DIAGNOSIS — R0981 Nasal congestion: Secondary | ICD-10-CM | POA: Diagnosis not present

## 2022-09-04 DIAGNOSIS — J45909 Unspecified asthma, uncomplicated: Secondary | ICD-10-CM | POA: Insufficient documentation

## 2022-09-04 DIAGNOSIS — Z20822 Contact with and (suspected) exposure to covid-19: Secondary | ICD-10-CM | POA: Diagnosis not present

## 2022-09-04 DIAGNOSIS — R059 Cough, unspecified: Secondary | ICD-10-CM | POA: Insufficient documentation

## 2022-09-04 LAB — RESP PANEL BY RT-PCR (RSV, FLU A&B, COVID)  RVPGX2
Influenza A by PCR: NEGATIVE
Influenza B by PCR: NEGATIVE
Resp Syncytial Virus by PCR: NEGATIVE
SARS Coronavirus 2 by RT PCR: NEGATIVE

## 2022-09-04 NOTE — Discharge Instructions (Signed)
Evaluation for your cough and congestion was overall reassuring.  X-ray was negative for pneumonia.  Advise you to follow-up with your PCP for persistent cough and congestion.  Recommend over-the-counter cough and decongestants for symptomatic treatment.  If you have shortness of breath, chest pain, new fever or any other concerning complaint please return emergency department for evaluation.

## 2022-09-04 NOTE — ED Provider Notes (Signed)
EMERGENCY DEPARTMENT AT [facility] Provider Note   CSN: [id number] Arrival date & time: 09/04/22  2121     History  Chief Complaint  Patient presents with   nasal drainage   HPI Herod Connerton is a 22 y.o. male with h/o asthma presenting for cough and congestion.  Symptoms started 2 years ago but have gotten worse in the last couple weeks.  Cough is productive with yellow-green sputum.  It is nonbloody.  Patient states that he does have a history of vaping but he stopped in October and no longer smokes.  Denies fever, shortness of breath, chest pain, and sore throat.  Has not taken any medications for symptoms.  HPI     Home Medications Prior to Admission medications   Medication Sig Start Date End Date Taking? Authorizing Provider  benzonatate (TESSALON) 100 MG capsule Take 1 capsule (100 mg total) by mouth every 8 (eight) hours. 05/31/20   Eustaquio Maize, PA-C  cetirizine (ZYRTEC ALLERGY) 10 MG tablet Take 1 tablet (10 mg total) by mouth daily. 07/07/21   Tedd Sias, PA  Diclofenac Sodium CR 100 MG 24 hr tablet Take 1 tablet (100 mg total) by mouth daily. 02/22/22   Palumbo, April, MD  fluticasone (FLONASE) 50 MCG/ACT nasal spray Place 2 sprays into both nostrils daily for 14 days. 07/07/21 07/21/21  Tedd Sias, PA  penicillin v potassium (VEETID) 500 MG tablet Take 1 tablet (500 mg total) by mouth 3 (three) times daily. 05/25/22   Cherlynn June B, PA-C  penicillin v potassium (VEETID) 500 MG tablet Take 1 tablet (500 mg total) by mouth 3 (three) times daily. 05/25/22   Dorothyann Peng, PA-C      Allergies    Patient has no known allergies.    Review of Systems   See HPI for pertinent positives   Physical Exam   Vitals:   09/04/22 2129  BP: 124/77  Pulse: (!) 56  Resp: 18  Temp: 98.2 F (36.8 C)  SpO2: 100%    CONSTITUTIONAL: well-appearing, NAD NEURO: Alert and oriented x 3, CN 3-12 grossly intact EYES:  eyes equal and  reactive ENT/NECK:  Supple, no stridor  CARDIO:  bradycardia and regular rhythm, appears well-perfused  PULM:  No respiratory distress, CTAB, no wheezing, rhonchi or rales GI/GU:  non-distended MSK/SPINE:  No gross deformities, no edema, moves all extremities  SKIN:  no rash, atraumatic  *Additional and/or pertinent findings included in MDM below    ED Results / Procedures / Treatments   Labs (all labs ordered are listed, but only abnormal results are displayed) Labs Reviewed  RESP PANEL BY RT-PCR (RSV, FLU A&B, COVID)  [id number]    EKG None  Radiology DG Chest 2 View  Result Date: 09/04/2022 CLINICAL DATA:  Cough EXAM: CHEST - 2 VIEW COMPARISON:  05/31/2020 FINDINGS: The heart size and mediastinal contours are within normal limits. Both lungs are clear. The visualized skeletal structures are unremarkable. IMPRESSION: No active cardiopulmonary disease. Electronically Signed   By: Fidela Salisbury M.D.   On: 09/04/2022 22:06    Procedures Procedures    Medications Ordered in ED Medications - No data to display  ED Course/ Medical Decision Making/ A&P                             Medical Decision Making Amount and/or Complexity of Data Reviewed Radiology: ordered.   22 year old male who is well-appearing  and hemodynamically stable presenting for cough congestion.  Exam unremarkable.  DDx includes pneumonia, URI, asthma or COPD.  Given the persistence of his symptoms thought warranted further evaluation with x-ray fortunately x-ray was unremarkable.  Respiratory panel was negative.  Discharged with stable vitals.  Advised him to follow-up with his PCP if his symptoms persist.  Discussed return precautions.        Final Clinical Impression(s) / ED Diagnoses Final diagnoses:  Cough, unspecified type  Nasal congestion    Rx / DC Orders ED Discharge Orders     None         Harriet Pho, PA-C 09/04/22 2225    Hayden Rasmussen, MD 09/05/22 [phone]

## 2022-09-04 NOTE — ED Triage Notes (Addendum)
Pt c/o nasal drainage, denies fevers.  No other complaints Has not tried any OTC meds

## 2023-01-05 ENCOUNTER — Emergency Department (HOSPITAL_BASED_OUTPATIENT_CLINIC_OR_DEPARTMENT_OTHER): Payer: BC Managed Care – PPO

## 2023-01-05 ENCOUNTER — Emergency Department (HOSPITAL_BASED_OUTPATIENT_CLINIC_OR_DEPARTMENT_OTHER)
Admission: EM | Admit: 2023-01-05 | Discharge: 2023-01-05 | Disposition: A | Discharge status: Home / Self Care | Payer: BC Managed Care – PPO | Attending: Emergency Medicine | Admitting: Emergency Medicine

## 2023-01-05 ENCOUNTER — Encounter (HOSPITAL_BASED_OUTPATIENT_CLINIC_OR_DEPARTMENT_OTHER): Payer: Self-pay

## 2023-01-05 DIAGNOSIS — Y93E5 Activity, floor mopping and cleaning: Secondary | ICD-10-CM | POA: Diagnosis not present

## 2023-01-05 DIAGNOSIS — J45909 Unspecified asthma, uncomplicated: Secondary | ICD-10-CM | POA: Insufficient documentation

## 2023-01-05 DIAGNOSIS — X500XXA Overexertion from strenuous movement or load, initial encounter: Secondary | ICD-10-CM | POA: Diagnosis not present

## 2023-01-05 DIAGNOSIS — S43014A Anterior dislocation of right humerus, initial encounter: Secondary | ICD-10-CM | POA: Insufficient documentation

## 2023-01-05 DIAGNOSIS — S43004A Unspecified dislocation of right shoulder joint, initial encounter: Secondary | ICD-10-CM

## 2023-01-05 DIAGNOSIS — S4991XA Unspecified injury of right shoulder and upper arm, initial encounter: Secondary | ICD-10-CM | POA: Diagnosis present

## 2023-01-05 MED ORDER — MORPHINE SULFATE (PF) 4 MG/ML IV SOLN
4.0000 mg | Freq: Once | INTRAVENOUS | Status: AC
  Administered 2023-01-05: 4 mg via INTRAVENOUS
  Filled 2023-01-05: qty 1

## 2023-01-05 MED ORDER — ETOMIDATE 2 MG/ML IV SOLN
10.0000 mg | Freq: Once | INTRAVENOUS | Status: AC | PRN
  Administered 2023-01-05: 10 mg via INTRAVENOUS
  Filled 2023-01-05: qty 10

## 2023-01-05 MED ORDER — ONDANSETRON HCL 4 MG/2ML IJ SOLN
4.0000 mg | Freq: Once | INTRAMUSCULAR | Status: AC
  Administered 2023-01-05: 4 mg via INTRAVENOUS
  Filled 2023-01-05: qty 2

## 2023-01-05 MED ORDER — MIDAZOLAM HCL 2 MG/2ML IJ SOLN
1.0000 mg | Freq: Once | INTRAMUSCULAR | Status: AC | PRN
  Administered 2023-01-05: 1 mg via INTRAVENOUS
  Filled 2023-01-05: qty 2

## 2023-01-05 NOTE — ED Provider Notes (Signed)
[facility] [facility] Provider Note   CSN: [id number] Arrival date & time: 01/05/23  1617     History  Chief Complaint  Patient presents with   Shoulder Pain    Jonathan Harris is a 22 y.o. male.  Pt is a 22 yo male with pmhx significant for asthma and previous dislocations of both shoulders.  He said he was cleaning his car today and bent his arm in a funny way and his shoulder popped out.  He tried to put it back in himself, but it would not go back in.       Home Medications Prior to Admission medications   Medication Sig Start Date End Date Taking? Authorizing Provider  benzonatate (TESSALON) 100 MG capsule Take 1 capsule (100 mg total) by mouth every 8 (eight) hours. 05/31/20   Tanda Rockers, PA-C  cetirizine (ZYRTEC ALLERGY) 10 MG tablet Take 1 tablet (10 mg total) by mouth daily. 07/07/21   Gailen Shelter, PA  Diclofenac Sodium CR 100 MG 24 hr tablet Take 1 tablet (100 mg total) by mouth daily. 02/22/22   Palumbo, April, MD  fluticasone (FLONASE) 50 MCG/ACT nasal spray Place 2 sprays into both nostrils daily for 14 days. 07/07/21 07/21/21  Gailen Shelter, PA  penicillin v potassium (VEETID) 500 MG tablet Take 1 tablet (500 mg total) by mouth 3 (three) times daily. 05/25/22   Barrie Dunker B, PA-C  penicillin v potassium (VEETID) 500 MG tablet Take 1 tablet (500 mg total) by mouth 3 (three) times daily. 05/25/22   Darrick Grinder, PA-C      Allergies    Patient has no known allergies.    Review of Systems   Review of Systems  Musculoskeletal:        Right shoulder pain  All other systems reviewed and are negative.   Physical Exam Updated Vital Signs BP (!) 118/91   Pulse (!) 55   Temp 98.2 F (36.8 C)   Resp 15   Ht 6' (1.829 m)   Wt 77.1 kg   SpO2 99%   BMI 23.06 kg/m  Physical Exam Vitals and nursing note reviewed.  Constitutional:      Appearance: Normal appearance.  HENT:     Head: Normocephalic and  atraumatic.     Right Ear: External ear normal.     Left Ear: External ear normal.     Nose: Nose normal.     Mouth/Throat:     Mouth: Mucous membranes are moist.     Pharynx: Oropharynx is clear.  Eyes:     Extraocular Movements: Extraocular movements intact.     Conjunctiva/sclera: Conjunctivae normal.     Pupils: Pupils are equal, round, and reactive to light.  Cardiovascular:     Rate and Rhythm: Normal rate and regular rhythm.     Pulses: Normal pulses.     Heart sounds: Normal heart sounds.  Pulmonary:     Effort: Pulmonary effort is normal.     Breath sounds: Normal breath sounds.  Abdominal:     General: Abdomen is flat. Bowel sounds are normal.     Palpations: Abdomen is soft.  Musculoskeletal:     Right shoulder: Decreased range of motion.     Cervical back: Normal range of motion and neck supple.  Skin:    General: Skin is warm.     Capillary Refill: Capillary refill takes less than 2 seconds.  Neurological:     General: No  focal deficit present.     Mental Status: He is alert and oriented to person, place, and time.  Psychiatric:        Mood and Affect: Mood normal.        Behavior: Behavior normal.     ED Results / Procedures / Treatments   Labs (all labs ordered are listed, but only abnormal results are displayed) Labs Reviewed - No data to display  EKG None  Radiology DG Shoulder Right Portable  Result Date: 01/05/2023 CLINICAL DATA:  Right shoulder dislocation. EXAM: RIGHT SHOULDER - 1 VIEW COMPARISON:  Right shoulder radiograph dated 02/22/2022. FINDINGS: Anterior dislocation of the right shoulder with possible impaction on the anterior glenoid rim. No acute fracture noted. The bones are well mineralized. No arthritic changes. The soft tissues are unremarkable. IMPRESSION: Anterior dislocation of the right shoulder. Electronically Signed   By: Elgie Collard M.D.   On: 01/05/2023 17:30    Procedures .Ortho Injury Treatment  Date/Time: 01/05/2023  5:43 PM  Performed by: Jacalyn Lefevre, MD Authorized by: Jacalyn Lefevre, MD   Consent:    Consent obtained:  Verbal   Consent given by:  Patient   Risks discussed:  Recurrent dislocation   Alternatives discussed:  No treatmentInjury location: shoulder Location details: right shoulder Injury type: dislocation Dislocation type: anterior Hill-Sachs deformity: no Chronicity: recurrent Pre-procedure neurovascular assessment: neurovascularly intact Pre-procedure distal perfusion: normal Pre-procedure neurological function: normal Pre-procedure range of motion: normal  Anesthesia: Local anesthesia used: no  Patient sedated: Yes. Refer to sedation procedure documentation for details of sedation. Manipulation performed: yes Reduction method: external rotation Reduction successful: yes X-ray confirmed reduction: yes Immobilization: sling Post-procedure neurovascular assessment: post-procedure neurovascularly intact Post-procedure distal perfusion: normal Post-procedure neurological function: normal Post-procedure range of motion: normal   .Sedation  Date/Time: 01/05/2023 5:43 PM  Performed by: Jacalyn Lefevre, MD Authorized by: Jacalyn Lefevre, MD   Consent:    Consent obtained:  Verbal   Consent given by:  Patient Universal protocol:    Immediately prior to procedure, a time out was called: yes     Patient identity confirmed:  Verbally with patient Indications:    Procedure performed:  Dislocation reduction   Procedure necessitating sedation performed by:  Physician performing sedation Pre-sedation assessment:    Time since last food or drink:  5   ASA classification: class 1 - normal, healthy patient     Mallampati score:  I - soft palate, uvula, fauces, pillars visible   Pre-sedation assessments completed and reviewed: airway patency, cardiovascular function, hydration status, mental status, nausea/vomiting, pain level, respiratory function and temperature   Immediate  pre-procedure details:    Reassessment: Patient reassessed immediately prior to procedure     Reviewed: vital signs, relevant labs/tests and NPO status     Verified: bag valve mask available, emergency equipment available, intubation equipment available and IV patency confirmed   Procedure details (see MAR for exact dosages):    Preoxygenation:  Room air   Sedation:  Etomidate and midazolam   Intended level of sedation: deep   Analgesia:  Morphine   Intra-procedure monitoring:  Blood pressure monitoring, cardiac monitor, continuous capnometry, continuous pulse oximetry, frequent LOC assessments and frequent vital sign checks   Intra-procedure events: none     Total Provider sedation time (minutes):  30 Post-procedure details:    Post-sedation assessment completed:  01/05/2023 5:47 PM   Attendance: Constant attendance by certified staff until patient recovered     Recovery: Patient returned to pre-procedure baseline  Post-sedation assessments completed and reviewed: airway patency, cardiovascular function, hydration status, mental status, nausea/vomiting, pain level and respiratory function     Patient is stable for discharge or admission: yes     Procedure completion:  Tolerated well, no immediate complications Reduction of dislocation  Date/Time: 01/05/2023 5:47 PM  Performed by: Jacalyn Lefevre, MD Authorized by: Jacalyn Lefevre, MD  Consent: Verbal consent obtained. Consent given by: patient Patient understanding: patient states understanding of the procedure being performed Patient identity confirmed: verbally with patient Time out: Immediately prior to procedure a "time out" was called to verify the correct patient, procedure, equipment, support staff and site/side marked as required. Local anesthesia used: no  Anesthesia: Local anesthesia used: no  Sedation: Patient sedated: yes Sedatives: midazolam and etomidate Analgesia: morphine Vitals: Vital signs were monitored during  sedation.  Patient tolerance: patient tolerated the procedure well with no immediate complications       Medications Ordered in ED Medications  morphine (PF) 4 MG/ML injection 4 mg (4 mg Intravenous Given 01/05/23 1646)  ondansetron (ZOFRAN) injection 4 mg (4 mg Intravenous Given 01/05/23 1646)  etomidate (AMIDATE) injection 10 mg (10 mg Intravenous Given 01/05/23 1714)  midazolam (VERSED) injection 1 mg (1 mg Intravenous Given 01/05/23 1714)    ED Course/ Medical Decision Making/ A&P                             Medical Decision Making Amount and/or Complexity of Data Reviewed Radiology: ordered.  Risk Prescription drug management.   This patient presents to the ED for concern of shoulder pain, this involves an extensive number of treatment options, and is a complaint that carries with it a high risk of complications and morbidity.  The differential diagnosis includes d/l, fx, strain   Co morbidities that complicate the patient evaluation  Asthma and previous d/l   Additional history obtained:  Additional history obtained from epic chart review  Imaging Studies ordered:  I ordered imaging studies including r shoulder pre reduction and post  I independently visualized and interpreted imaging which showed  Pre-reduction: Anterior dislocation of the right shoulder.  Post-red: Interval reduction of the right glenohumeral joint.  I agree with the radiologist interpretation   Cardiac Monitoring:  The patient was maintained on a cardiac monitor.  I personally viewed and interpreted the cardiac monitored which showed an underlying rhythm of: nsr   Medicines ordered and prescription drug management:  I ordered medication including morphine/zofran  for pain  Reevaluation of the patient after these medicines showed that the patient improved I have reviewed the patients home medicines and have made adjustments as needed   Critical  Interventions:  Sedation/reduction   Problem List / ED Course:  R shoulder dislocation:  shoulder reduced   Reevaluation:  After the interventions noted above, I reevaluated the patient and found that they have :improved   Social Determinants of Health:  Lives at home   Dispostion:  After consideration of the diagnostic results and the patients response to treatment, I feel that the patent would benefit from discharge with outpatient f/u.          Final Clinical Impression(s) / ED Diagnoses Final diagnoses:  Dislocation, shoulder closed, right, initial encounter    Rx / DC Orders ED Discharge Orders     None         Jacalyn Lefevre, MD 01/05/23 1827

## 2023-01-05 NOTE — ED Notes (Addendum)
Pt brought to ED by his father & will be going home with him

## 2023-01-05 NOTE — ED Triage Notes (Signed)
Pt was washing his car and felt his right shoulder dislocate. Denies falling, just positioned funny and came out of place. Hx of same.

## 2023-01-05 NOTE — ED Notes (Signed)
At bedside t/o sedation with EtCO2 monitoring.

## 2023-05-28 ENCOUNTER — Emergency Department (HOSPITAL_BASED_OUTPATIENT_CLINIC_OR_DEPARTMENT_OTHER)
Admission: EM | Admit: 2023-05-28 | Discharge: 2023-05-28 | Disposition: A | Discharge status: Home / Self Care | Payer: BC Managed Care – PPO | Attending: Emergency Medicine | Admitting: Emergency Medicine

## 2023-05-28 ENCOUNTER — Other Ambulatory Visit: Payer: Self-pay

## 2023-05-28 ENCOUNTER — Encounter (HOSPITAL_BASED_OUTPATIENT_CLINIC_OR_DEPARTMENT_OTHER): Payer: Self-pay | Admitting: Emergency Medicine

## 2023-05-28 ENCOUNTER — Emergency Department (HOSPITAL_BASED_OUTPATIENT_CLINIC_OR_DEPARTMENT_OTHER): Payer: BC Managed Care – PPO

## 2023-05-28 DIAGNOSIS — R072 Precordial pain: Secondary | ICD-10-CM | POA: Insufficient documentation

## 2023-05-28 DIAGNOSIS — Y92410 Unspecified street and highway as the place of occurrence of the external cause: Secondary | ICD-10-CM | POA: Insufficient documentation

## 2023-05-28 DIAGNOSIS — M791 Myalgia, unspecified site: Secondary | ICD-10-CM | POA: Diagnosis not present

## 2023-05-28 DIAGNOSIS — R0789 Other chest pain: Secondary | ICD-10-CM

## 2023-05-28 MED ORDER — CYCLOBENZAPRINE HCL 5 MG PO TABS
5.0000 mg | ORAL_TABLET | Freq: Once | ORAL | Status: AC
  Administered 2023-05-28: 5 mg via ORAL
  Filled 2023-05-28: qty 1

## 2023-05-28 MED ORDER — NAPROXEN 250 MG PO TABS
500.0000 mg | ORAL_TABLET | Freq: Once | ORAL | Status: AC
  Administered 2023-05-28: 500 mg via ORAL
  Filled 2023-05-28: qty 2

## 2023-05-28 MED ORDER — CYCLOBENZAPRINE HCL 5 MG PO TABS: 5.0000 mg | ORAL_TABLET | Freq: Three times a day (TID) | ORAL | 0 refills | Status: AC | PRN
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
## 2023-05-28 NOTE — ED Notes (Addendum)
Pt transported to xray via wheel chair

## 2024-05-21 ENCOUNTER — Emergency Department (HOSPITAL_BASED_OUTPATIENT_CLINIC_OR_DEPARTMENT_OTHER)
Admission: EM | Admit: 2024-05-21 | Discharge: 2024-05-21 | Disposition: A | Discharge status: Home / Self Care | Attending: Emergency Medicine | Admitting: Emergency Medicine

## 2024-05-21 ENCOUNTER — Other Ambulatory Visit: Payer: Self-pay

## 2024-05-21 ENCOUNTER — Encounter (HOSPITAL_BASED_OUTPATIENT_CLINIC_OR_DEPARTMENT_OTHER): Payer: Self-pay

## 2024-05-21 DIAGNOSIS — J029 Acute pharyngitis, unspecified: Secondary | ICD-10-CM | POA: Diagnosis present

## 2024-05-21 LAB — RESP PANEL BY RT-PCR (RSV, FLU A&B, COVID)  RVPGX2
Influenza A by PCR: NEGATIVE
Influenza B by PCR: NEGATIVE
Resp Syncytial Virus by PCR: NEGATIVE
SARS Coronavirus 2 by RT PCR: NEGATIVE

## 2024-05-21 LAB — GROUP A STREP BY PCR: Group A Strep by PCR: NOT DETECTED

## 2024-05-21 NOTE — ED Triage Notes (Signed)
 Pt states for the last month he has had a sore throat, itchy left ear/sometimes the right ear, and tonsil stones. States it feels like something is stuck in his throat at times.   Denies fevers, n/v/d, or other sysmptoms.

## 2024-05-21 NOTE — Discharge Instructions (Signed)
 Please keep well-hydrated with water at home as this will help with saliva production and help any tonsil stones to come out.  You can also chew gum or have some sour candies as these will help with saliva production and also help tonsil stones to come out.  You can massage to the area that you feel the stone to help it come out.  Your COVID, flu, RSV, and strep testing was negative today.  Please follow-up with your PCP if symptoms are not starting to improve on their own within the next week.  Please return to the ER if you have any difficulties with swallowing, fevers, any other new or concerning symptoms

## 2024-05-21 NOTE — ED Provider Notes (Signed)
 [place] [facility] Provider Note   CSN: [id number] Arrival date & time: 05/21/24  9240     Patient presents with: Sore Throat   Jonathan Harris is a 23 y.o. male reports a history of tonsil stones, presents with concern for tonsil stones.  States he feels like the stones more recently have been getting stuck.  He also reports a sore throat for the past week.  He also reports some itching in his ears occasionally.  Denies any fever, chills, cough, runny nose.  Denies any difficulties with swallowing.    Sore Throat       Prior to Admission medications   Medication Sig Start Date End Date Taking? Authorizing Provider  benzonatate  (TESSALON ) 100 MG capsule Take 1 capsule (100 mg total) by mouth every 8 (eight) hours. 05/31/20   Venter, Margaux, PA-C  cetirizine  (ZYRTEC  ALLERGY) 10 MG tablet Take 1 tablet (10 mg total) by mouth daily. 07/07/21   Neldon Hamp RAMAN, PA  cyclobenzaprine  (FLEXERIL ) 5 MG tablet Take 1 tablet (5 mg total) by mouth 3 (three) times daily as needed. 05/28/23   Small, Brooke L, PA  Diclofenac  Sodium CR 100 MG 24 hr tablet Take 1 tablet (100 mg total) by mouth daily. 02/22/22   Palumbo, April, MD  fluticasone  (FLONASE ) 50 MCG/ACT nasal spray Place 2 sprays into both nostrils daily for 14 days. 07/07/21 07/21/21  Neldon Hamp RAMAN, PA  penicillin  v potassium (VEETID) 500 MG tablet Take 1 tablet (500 mg total) by mouth 3 (three) times daily. 05/25/22   Logan Ubaldo NOVAK, PA-C  penicillin  v potassium (VEETID) 500 MG tablet Take 1 tablet (500 mg total) by mouth 3 (three) times daily. 05/25/22   Logan Ubaldo NOVAK, PA-C    Allergies: Patient has no known allergies.    Review of Systems  Constitutional:  Negative for fever.  HENT:  Positive for sore throat.     Updated Vital Signs BP 124/84 (BP Location: Right Arm)   Pulse (!) 52   Temp 98 F (36.7 C) (Oral)   Resp 16   Ht 6' (1.829 m)   Wt 81.6 kg   SpO2 100%   BMI 24.41  kg/m   Physical Exam Vitals and nursing note reviewed.  Constitutional:      General: He is not in acute distress.    Appearance: He is well-developed.  HENT:     Head: Normocephalic and atraumatic.     Right Ear: Tympanic membrane, ear canal and external ear normal.     Left Ear: Tympanic membrane, ear canal and external ear normal.     Mouth/Throat:     Pharynx: No oropharyngeal exudate or posterior oropharyngeal erythema.     Comments: No visualized or palpated tonsil stones on exam Patient swallowing without difficulty No trismus No visualized peritonsillar or dental abscesses Eyes:     Conjunctiva/sclera: Conjunctivae normal.  Cardiovascular:     Rate and Rhythm: Normal rate and regular rhythm.     Heart sounds: No murmur heard. Pulmonary:     Effort: Pulmonary effort is normal. No respiratory distress.     Breath sounds: Normal breath sounds.  Abdominal:     Palpations: Abdomen is soft.     Tenderness: There is no abdominal tenderness.  Musculoskeletal:        General: No swelling.     Cervical back: Normal range of motion and neck supple.  Lymphadenopathy:     Cervical: No cervical adenopathy.  Skin:  General: Skin is warm and dry.     Capillary Refill: Capillary refill takes less than 2 seconds.  Neurological:     Mental Status: He is alert.  Psychiatric:        Mood and Affect: Mood normal.     (all labs ordered are listed, but only abnormal results are displayed) Labs Reviewed  GROUP A STREP BY PCR  RESP PANEL BY RT-PCR (RSV, FLU A&B, COVID)  RVPGX2    EKG: None  Radiology: No results found.   Procedures   Medications Ordered in the ED - No data to display                                  Medical Decision Making    Differential diagnosis includes but is not limited to tonsil stones, sialadenitis, COVID, flu, RSV, viral URI, strep pharyngitis, viral pharyngitis, allergic rhinitis, pneumonia, bronchitis   ED Course:  Upon initial  evaluation, patient is well-appearing, no acute distress.  Normal vital signs aside from bradycardia in the 50s.  This seems baseline for patient per review of previous visits vitals. Patient without any erythema or edema of the posterior oropharynx.  Tonsils without any exudate or evidence of peritonsillar abscess.  I am not able to visualize any tonsil stones on exam.  Patient swallowing without difficulty.  No trismus.  Neck soft and supple.  I do not visualize any otitis media or otitis externa.  Lungs clear to auscultation bilaterally.  Labs Ordered: I Ordered, and personally interpreted labs.  The pertinent results include:   COVID, flu, RSV, strep negative  Medications Given: None  Upon re-evaluation, patient remains well appearing with stable vitals.  Strep, COVID, flu, RSV testing negative today.  I do not visualize or palpate any tonsil stones on exam today, but patient reports this is what the pain feels like.  No signs of deep space infection.  Feel he is stable and appropriate for discharge home.    Impression: Throat pain  Disposition:  Discharged home with instructions to use over-the-counter medications as needed for symptom control. Sialogogues and keeping well hydrated to help with tonsil stones.  Instructed patient he may massage the area if he feels a tonsil stone present to help it come out. Follow-up with PCP if symptoms not improving within the next 5 days. Return precautions given and patient verbalized understanding.    This chart was dictated using voice recognition software, Dragon. Despite the best efforts of this provider to proofread and correct errors, errors may still occur which can change documentation meaning.       Final diagnoses:  Sore throat    ED Discharge Orders     None          Veta Palma, [place] 05/21/24 9063    Dreama Longs, MD 05/21/24 1115

## 2024-07-01 ENCOUNTER — Encounter (HOSPITAL_BASED_OUTPATIENT_CLINIC_OR_DEPARTMENT_OTHER): Payer: Self-pay | Admitting: Emergency Medicine

## 2024-07-01 ENCOUNTER — Emergency Department (HOSPITAL_BASED_OUTPATIENT_CLINIC_OR_DEPARTMENT_OTHER)
Admission: EM | Admit: 2024-07-01 | Discharge: 2024-07-01 | Disposition: A | Discharge status: Home / Self Care | Attending: Emergency Medicine | Admitting: Emergency Medicine

## 2024-07-01 ENCOUNTER — Other Ambulatory Visit: Payer: Self-pay

## 2024-07-01 DIAGNOSIS — J101 Influenza due to other identified influenza virus with other respiratory manifestations: Secondary | ICD-10-CM | POA: Insufficient documentation

## 2024-07-01 DIAGNOSIS — R509 Fever, unspecified: Secondary | ICD-10-CM | POA: Diagnosis present

## 2024-07-01 LAB — RESP PANEL BY RT-PCR (RSV, FLU A&B, COVID)  RVPGX2
Influenza A by PCR: POSITIVE — AB
Influenza B by PCR: NEGATIVE
Resp Syncytial Virus by PCR: NEGATIVE
SARS Coronavirus 2 by RT PCR: NEGATIVE

## 2024-07-01 MED ORDER — ACETAMINOPHEN 325 MG PO TABS
650.0000 mg | ORAL_TABLET | Freq: Once | ORAL | Status: AC | PRN
  Administered 2024-07-01: 650 mg via ORAL
  Filled 2024-07-01: qty 2

## 2024-07-01 NOTE — Discharge Instructions (Signed)
 ### Influenza A: Home Care and Treatment Information     ## What is Influenza A?        Influenza A is a viral infection that affects your respiratory system (nose, throat, and lungs). Common symptoms include fever, cough, sore throat, body aches, headache, chills, and fatigue. Most people recover within 1 week, though cough and tiredness can last longer.[1][2]      ## Fever and Pain Management        You can manage fever and discomfort with over-the-counter medications:      **Acetaminophen  (Tylenol ):**      - Take [phone] mg every 4-6 hours as needed      - Do not exceed 3000-4000 mg in 24 hours      **Ibuprofen (Advil, Motrin):**      - Take 400-600 mg every 6 hours as needed      - Do not exceed 2400 mg in 24 hours      **Alternating Schedule (if recommended by your doctor):**      You may alternate between acetaminophen  and ibuprofen every 3 hours to help control fever and discomfort. For example:      - 8:00 AM: Acetaminophen  650 mg      - 11:00 AM: Ibuprofen 400 mg      - 2:00 PM: Acetaminophen  650 mg      - 5:00 PM: Ibuprofen 400 mg      Both medications are equally effective and safe for treating fever and pain.[3][4] Keep a written log of when you take each medication to avoid confusion or accidental overdose.      ## Why Antibiotics Won't Help        **Viral vs. Bacterial Infections:**      Influenza is caused by a **virus**, not bacteria. Understanding this difference is important:      - **Viral infections** like influenza are caused by viruses that invade your cells and use them to reproduce. Viruses have different structures and mechanisms than bacteria.[5]      - **Bacterial infections** are caused by bacteria, which are different types of organisms with their own cell walls and biological processes.      **Why antibiotics don't work for influenza:**      Antibiotics are designed to kill bacteria by targeting specific bacterial structures (like  cell walls) or processes (like bacterial DNA replication) that viruses don't have.[5][6] Taking antibiotics for a viral infection like influenza:      - Will not help you feel better or recover faster      - Will not prevent complications      - Can cause side effects like nausea, diarrhea, or allergic reactions      - Contributes to antibiotic resistance, making antibiotics less effective when you truly need them[7][6]      Only 0.5-2% of viral respiratory infections develop secondary bacterial complications that would require antibiotics.[6] Your doctor will prescribe antibiotics only if you develop a bacterial infection as a complication of influenza.      ## Home Supportive Care        **Rest and Recovery:**      - Get plenty of rest to help your body fight the infection      - Stay home from work or school to avoid spreading the virus to others      - Expect to feel tired for 1-2 weeks, even after other symptoms improve[1][2]      **Hydration:**      -  Drink plenty of fluids (water, warm tea, broth, electrolyte drinks)      - Avoid alcohol and caffeine, which can cause dehydration      - Adequate hydration helps thin mucus and prevents dehydration from fever      **Comfort Measures:**      - Use a humidifier or breathe steam from a hot shower to ease congestion      - Gargle with warm salt water for sore throat      - Use saline nasal spray or rinses for nasal congestion      - Keep your room at a comfortable temperature      **Nutrition:**      - Eat when you feel able, focusing on easy-to-digest foods      - Don't force yourself to eat if you have no appetite, but maintain fluid intake      ## When to Seek Medical Attention        Contact your doctor or seek emergency care if you experience:[8][1][9]      - **Difficulty breathing or shortness of breath**      - **Chest pain or pressure**      - **Confusion or difficulty waking up**      - **Severe or  persistent vomiting**      - **Dizziness or inability to stand**      - **Seizures**      - **Severe muscle pain or weakness**      - **Fever that returns after improving**      - **Worsening of chronic medical conditions (heart disease, asthma, diabetes)**      - **Symptoms that improve but then return with fever and worse cough**         ## Preventing Spread to Others        - Wash your hands frequently with soap and water      - Cover coughs and sneezes with a tissue or your elbow      - Avoid close contact with others, especially those at high risk      - Stay home until fever-free for at least 24 hours without fever-reducing medication      - Clean frequently touched surfaces regularly      ### References  1. Influenza. Park MAPLES, Hui DS, Zambon M, Wentworth DE, Monto AS. Lancet Vick, [place]). 2022;400(10353):693-706. doi:10.1016/S0140-6736(22)00982-5. 2. Influenza: Diagnosis and Treatment. Gaitonde DY, Moore FC, [facility]. American Family Physician. 2019;100(12):751-758. 3. Comparison of Intravenous Ibuprofen and Paracetamol in the Treatment of Fever: A Randomized Double-Blind Study. Can , K?yan GS, Yal?nl? S. The American Journal of Emergency Medicine. 2021;46:102-106. doi:10.1016/j.ajem.2021.02.057. 4. Efficacy and Safety of Ibuprofen and Acetaminophen  in Children and Adults: A Meta-Analysis and Qualitative Review. Lyle HAKIM, [place] B. The Annals of Pharmacotherapy. 2010;44(3):489-506. doi:10.1345/aph.1M332. 5. A Comprehensive Review of Antimicrobial Drugs: Mechanisms of Action and Specific Targets in Microorganisms. Shekhawat D, Gouthami K, Santra A, et al. Journal of Basic Microbiology. 2025;:e70057. doi:10.1002/jobm.29942. 6. Antimicrobial Treatment Guidelines for Acute Bacterial Rhinosinusitis. Herma FOYE Cedar MR, Malcolm MD, et al. Otolaryngology--Head and Neck Surgery : Official Journal of American Academy of Otolaryngology-Head and Neck Surgery. 2004;130(1  Suppl):1-45. doi:10.1016/j.otohns.2003.12.003. 7. Distinction Between Bacterial and Viral Infections. Nuutila J, Lilius EM. Current Opinion in Infectious Diseases. 2007;20(3):304-10. doi:10.1097/QCO.[id number] db4. 8. Clinical Practice Guidelines by the Infectious Diseases Society of America: 2018 Update on Diagnosis, Treatment, Chemoprophylaxis, and Institutional Outbreak Management of Seasonal Influenzaa. Park MAPLES Jaeger HH, Adine PURCHASE, et al. Clinical Infectious Diseases : An Official  Publication of the Infectious Diseases Society of America. 2019;68(6):e1-e47. doi:10.1093/cid/ciy866. 9. Influenza. Wanda Sires, Michael Daugherty, Eduardo Azziz-Baumgartner. CDC Yellow Book. 10. Influenza A(H3N2) Subclade K Virus. Zambon M, Hayden FG. JAMA. [phone]. doi:10.1001/jama.7974.74096.

## 2024-07-01 NOTE — ED Provider Notes (Signed)
 " [place] [facility] Provider Note   CSN: [id number] Arrival date & time: 07/01/24  1411     Patient presents with: Fever   Jonathan Harris is a 23 y.o. male who presents emergency department chief complaint of flulike symptoms x 4 days.  He complains of cough, body aches, chills, headache.  Patient has had fever.  Initial temperature coming in was 103 degrees F.  He was given antipyretics with improvement in his fever and feels much better.  He denies any shortness of breath, nausea or vomit   Fever      Prior to Admission medications  Medication Sig Start Date End Date Taking? Authorizing Provider  benzonatate  (TESSALON ) 100 MG capsule Take 1 capsule (100 mg total) by mouth every 8 (eight) hours. 05/31/20   Shepard, Margaux, PA-C  cetirizine  (ZYRTEC  ALLERGY) 10 MG tablet Take 1 tablet (10 mg total) by mouth daily. 07/07/21   Neldon Hamp RAMAN, PA  cyclobenzaprine  (FLEXERIL ) 5 MG tablet Take 1 tablet (5 mg total) by mouth 3 (three) times daily as needed. 05/28/23   Small, Brooke L, PA  Diclofenac  Sodium CR 100 MG 24 hr tablet Take 1 tablet (100 mg total) by mouth daily. 02/22/22   Palumbo, April, MD  fluticasone  (FLONASE ) 50 MCG/ACT nasal spray Place 2 sprays into both nostrils daily for 14 days. 07/07/21 07/21/21  Neldon Hamp RAMAN, PA  penicillin  v potassium (VEETID) 500 MG tablet Take 1 tablet (500 mg total) by mouth 3 (three) times daily. 05/25/22   Logan Ubaldo NOVAK, PA-C  penicillin  v potassium (VEETID) 500 MG tablet Take 1 tablet (500 mg total) by mouth 3 (three) times daily. 05/25/22   Logan Ubaldo NOVAK, PA-C    Allergies: Patient has no known allergies.    Review of Systems  Constitutional:  Positive for fever.    Updated Vital Signs BP 125/79 (BP Location: Right Arm)   Pulse 94   Temp (!) 100.7 F (38.2 C) (Oral)   Resp 19   Ht 6' (1.829 m)   Wt 77.1 kg   SpO2 98%   BMI 23.06 kg/m   Physical Exam Vitals and nursing note  reviewed.  Constitutional:      General: He is not in acute distress.    Appearance: He is well-developed. He is ill-appearing. He is not toxic-appearing or diaphoretic.  HENT:     Head: Normocephalic and atraumatic.  Eyes:     General: No scleral icterus.    Conjunctiva/sclera: Conjunctivae normal.  Cardiovascular:     Rate and Rhythm: Normal rate and regular rhythm.     Heart sounds: Normal heart sounds.  Pulmonary:     Effort: Pulmonary effort is normal. No respiratory distress.     Breath sounds: Normal breath sounds.  Abdominal:     Palpations: Abdomen is soft.     Tenderness: There is no abdominal tenderness.  Musculoskeletal:     Cervical back: Normal range of motion and neck supple.  Skin:    General: Skin is warm and dry.  Neurological:     Mental Status: He is alert.  Psychiatric:        Behavior: Behavior normal.     (all labs ordered are listed, but only abnormal results are displayed) Labs Reviewed  RESP PANEL BY RT-PCR (RSV, FLU A&B, COVID)  RVPGX2 - Abnormal; Notable for the following components:      Result Value   Influenza A by PCR POSITIVE (*)  All other components within normal limits    EKG: None  Radiology: No results found.   Procedures   Medications Ordered in the ED  acetaminophen  (TYLENOL ) tablet 650 mg (650 mg Oral Given 07/01/24 1453)                                    Medical Decision Making Risk OTC drugs.   Patient here with influenza A 4 days in he is out of the window for Tamiflu treatment.  Patient asked if he could get an antibiotic.  I discussed that viruses are abiotic and cannot be killed by antibiotics.  He can continue with symptomatic treatment he responded well to antipyretics he appears appropriate for discharge with improved vital signs.     Final diagnoses:  None    ED Discharge Orders     None          Arloa Chroman, PA-C 07/01/24 1955    Ruthe Cornet, DO 07/03/24 1919  "

## 2024-07-01 NOTE — ED Triage Notes (Signed)
 Pt reports fever, HA and cough, congestion, body aches x 4 days; OTC medication not helping

## 2024-07-04 ENCOUNTER — Emergency Department (HOSPITAL_BASED_OUTPATIENT_CLINIC_OR_DEPARTMENT_OTHER)
Admission: EM | Admit: 2024-07-04 | Discharge: 2024-07-05 | Disposition: A | Attending: Emergency Medicine | Admitting: Emergency Medicine

## 2024-07-04 ENCOUNTER — Encounter (HOSPITAL_BASED_OUTPATIENT_CLINIC_OR_DEPARTMENT_OTHER): Payer: Self-pay

## 2024-07-04 ENCOUNTER — Other Ambulatory Visit: Payer: Self-pay

## 2024-07-04 DIAGNOSIS — A09 Infectious gastroenteritis and colitis, unspecified: Secondary | ICD-10-CM | POA: Insufficient documentation

## 2024-07-04 DIAGNOSIS — R1030 Lower abdominal pain, unspecified: Secondary | ICD-10-CM | POA: Insufficient documentation

## 2024-07-04 DIAGNOSIS — R3121 Asymptomatic microscopic hematuria: Secondary | ICD-10-CM | POA: Insufficient documentation

## 2024-07-04 LAB — BASIC METABOLIC PANEL WITH GFR
Anion gap: 11 (ref 5–15)
BUN: 14 mg/dL (ref 6–20)
CO2: 27 mmol/L (ref 22–32)
Calcium: 9 mg/dL (ref 8.9–10.3)
Chloride: 103 mmol/L (ref 98–111)
Creatinine, Ser: 1.68 mg/dL — ABNORMAL HIGH (ref 0.61–1.24)
GFR, Estimated: 58 mL/min — ABNORMAL LOW
Glucose, Bld: 130 mg/dL — ABNORMAL HIGH (ref 70–99)
Potassium: 4.1 mmol/L (ref 3.5–5.1)
Sodium: 142 mmol/L (ref 135–145)

## 2024-07-04 LAB — CBC WITH DIFFERENTIAL/PLATELET
Abs Immature Granulocytes: 0.04 K/uL (ref 0.00–0.07)
Basophils Absolute: 0 K/uL (ref 0.0–0.1)
Basophils Relative: 1 %
Eosinophils Absolute: 0 K/uL (ref 0.0–0.5)
Eosinophils Relative: 1 %
HCT: 45.4 % (ref 39.0–52.0)
Hemoglobin: 14.7 g/dL (ref 13.0–17.0)
Immature Granulocytes: 1 %
Lymphocytes Relative: 59 %
Lymphs Abs: 2 K/uL (ref 0.7–4.0)
MCH: 26.4 pg (ref 26.0–34.0)
MCHC: 32.4 g/dL (ref 30.0–36.0)
MCV: 81.7 fL (ref 80.0–100.0)
Monocytes Absolute: 0.3 K/uL (ref 0.1–1.0)
Monocytes Relative: 8 %
Neutro Abs: 1 K/uL — ABNORMAL LOW (ref 1.7–7.7)
Neutrophils Relative %: 30 %
Platelets: 114 K/uL — ABNORMAL LOW (ref 150–400)
RBC: 5.56 MIL/uL (ref 4.22–5.81)
RDW: 13.9 % (ref 11.5–15.5)
Smear Review: NORMAL
WBC: 3.3 K/uL — ABNORMAL LOW (ref 4.0–10.5)
nRBC: 0 % (ref 0.0–0.2)

## 2024-07-04 NOTE — ED Triage Notes (Signed)
 Recently dx with flu 2 days ago.   C/o abdominal pain, denies N/V

## 2024-07-05 ENCOUNTER — Emergency Department (HOSPITAL_BASED_OUTPATIENT_CLINIC_OR_DEPARTMENT_OTHER)

## 2024-07-05 LAB — URINALYSIS, ROUTINE W REFLEX MICROSCOPIC
Bilirubin Urine: NEGATIVE
Glucose, UA: NEGATIVE mg/dL
Ketones, ur: NEGATIVE mg/dL
Leukocytes,Ua: NEGATIVE
Nitrite: NEGATIVE
Protein, ur: 30 mg/dL — AB
Specific Gravity, Urine: 1.01 (ref 1.005–1.030)
pH: 6 (ref 5.0–8.0)

## 2024-07-05 LAB — URINALYSIS, MICROSCOPIC (REFLEX)

## 2024-07-05 MED ORDER — HYOSCYAMINE SULFATE 0.125 MG SL SUBL: 0.1250 mg | SUBLINGUAL_TABLET | SUBLINGUAL | 0 refills | Status: AC | PRN

## 2024-07-05 MED ORDER — HYOSCYAMINE SULFATE 0.125 MG SL SUBL
0.2500 mg | SUBLINGUAL_TABLET | Freq: Once | SUBLINGUAL | Status: AC
  Administered 2024-07-05: 0.25 mg via SUBLINGUAL
  Filled 2024-07-05: qty 2

## 2024-07-05 NOTE — ED Notes (Signed)
 Patient transported to CT

## 2024-07-05 NOTE — Discharge Instructions (Addendum)
 Your workup has been very normal.  Your abdominal pain and cramping is likely because of the diarrhea which was caused by influenza.  You can use Imodium if needed.  We did see blood in your urine when we checked your urine sample.  This needs to be rechecked to make sure that it resolves.  Please schedule follow-up with urology, listed above.

## 2024-07-05 NOTE — ED Provider Notes (Signed)
 " [place] [facility] Provider Note   CSN: [id number] Arrival date & time: 07/04/24  2244     Patient presents with: Abdominal Pain   Jonathan Harris is a 23 y.o. male.   Presents for lower abdominal pain.  Patient was seen in the ED several days ago and diagnosed with influenza.  At that time he was having headaches, cough, generalized aches and pains.  He then developed diarrhea which was present for several days.  No rectal bleeding.  Patient reports that the diarrhea is starting to ease off but he is still experiencing cramping across the lower abdomen.       Prior to Admission medications  Medication Sig Start Date End Date Taking? Authorizing Provider  hyoscyamine  (LEVSIN  SL) 0.125 MG SL tablet Place 1 tablet (0.125 mg total) under the tongue every 4 (four) hours as needed for cramping (abdominal pain). 07/05/24  Yes Nicolette Gieske, Lonni PARAS, MD  benzonatate  (TESSALON ) 100 MG capsule Take 1 capsule (100 mg total) by mouth every 8 (eight) hours. 05/31/20   Venter, Margaux, PA-C  cetirizine  (ZYRTEC  ALLERGY) 10 MG tablet Take 1 tablet (10 mg total) by mouth daily. 07/07/21   Neldon Hamp RAMAN, PA  cyclobenzaprine  (FLEXERIL ) 5 MG tablet Take 1 tablet (5 mg total) by mouth 3 (three) times daily as needed. 05/28/23   Small, Brooke L, PA  Diclofenac  Sodium CR 100 MG 24 hr tablet Take 1 tablet (100 mg total) by mouth daily. 02/22/22   Palumbo, April, MD  fluticasone  (FLONASE ) 50 MCG/ACT nasal spray Place 2 sprays into both nostrils daily for 14 days. 07/07/21 07/21/21  Neldon Hamp RAMAN, PA  penicillin  v potassium (VEETID) 500 MG tablet Take 1 tablet (500 mg total) by mouth 3 (three) times daily. 05/25/22   Logan Ubaldo NOVAK, PA-C  penicillin  v potassium (VEETID) 500 MG tablet Take 1 tablet (500 mg total) by mouth 3 (three) times daily. 05/25/22   Logan Ubaldo NOVAK, PA-C    Allergies: Patient has no known allergies.    Review of Systems  Updated Vital  Signs BP 112/78 (BP Location: Right Arm)   Pulse 73   Temp 98.9 F (37.2 C)   Resp 16   Ht 6' (1.829 m)   Wt 77.1 kg   SpO2 98%   BMI 23.06 kg/m   Physical Exam Vitals and nursing note reviewed.  Constitutional:      General: He is not in acute distress.    Appearance: He is well-developed.  HENT:     Head: Normocephalic and atraumatic.     Mouth/Throat:     Mouth: Mucous membranes are moist.  Eyes:     General: Vision grossly intact. Gaze aligned appropriately.     Extraocular Movements: Extraocular movements intact.     Conjunctiva/sclera: Conjunctivae normal.  Cardiovascular:     Rate and Rhythm: Normal rate and regular rhythm.     Pulses: Normal pulses.     Heart sounds: Normal heart sounds, S1 normal and S2 normal. No murmur heard.    No friction rub. No gallop.  Pulmonary:     Effort: Pulmonary effort is normal. No respiratory distress.     Breath sounds: Normal breath sounds.  Abdominal:     Palpations: Abdomen is soft.     Tenderness: There is abdominal tenderness in the suprapubic area. There is no guarding or rebound.     Hernia: No hernia is present.  Musculoskeletal:  General: No swelling.     Cervical back: Full passive range of motion without pain, normal range of motion and neck supple. No pain with movement, spinous process tenderness or muscular tenderness. Normal range of motion.     Right lower leg: No edema.     Left lower leg: No edema.  Skin:    General: Skin is warm and dry.     Capillary Refill: Capillary refill takes less than 2 seconds.     Findings: No ecchymosis, erythema, lesion or wound.  Neurological:     Mental Status: He is alert and oriented to person, place, and time.     GCS: GCS eye subscore is 4. GCS verbal subscore is 5. GCS motor subscore is 6.     Cranial Nerves: Cranial nerves 2-12 are intact.     Sensory: Sensation is intact.     Motor: Motor function is intact. No weakness or abnormal muscle tone.     Coordination:  Coordination is intact.  Psychiatric:        Mood and Affect: Mood normal.        Speech: Speech normal.        Behavior: Behavior normal.     (all labs ordered are listed, but only abnormal results are displayed) Labs Reviewed  CBC WITH DIFFERENTIAL/PLATELET - Abnormal; Notable for the following components:      Result Value   WBC 3.3 (*)    Platelets 114 (*)    Neutro Abs 1.0 (*)    All other components within normal limits  BASIC METABOLIC PANEL WITH GFR - Abnormal; Notable for the following components:   Glucose, Bld 130 (*)    Creatinine, Ser 1.68 (*)    GFR, Estimated 58 (*)    All other components within normal limits  URINALYSIS, ROUTINE W REFLEX MICROSCOPIC - Abnormal; Notable for the following components:   Hgb urine dipstick MODERATE (*)    Protein, ur 30 (*)    All other components within normal limits  URINALYSIS, MICROSCOPIC (REFLEX) - Abnormal; Notable for the following components:   Bacteria, UA RARE (*)    All other components within normal limits    EKG: None  Radiology: CT RENAL STONE STUDY Result Date: 07/05/2024 EXAM: CT UROGRAM 07/05/2024 12:48:28 AM TECHNIQUE: CT of the abdomen and pelvis was performed before and after the administration of intravenous contrast as per CT urogram protocol. Multiplanar reformatted images as well as MIP urogram images are provided for review. Automated exposure control, iterative reconstruction, and/or weight based adjustment of the mA/kV was utilized to reduce the radiation dose to as low as reasonably achievable. COMPARISON: None available. CLINICAL HISTORY: Abdominal/flank pain; stone; hematuria. FINDINGS: LOWER CHEST: No acute abnormality. LIVER: The liver is unremarkable. GALLBLADDER AND BILE DUCTS: Gallbladder is unremarkable. No biliary ductal dilatation. SPLEEN: No acute abnormality. PANCREAS: No acute abnormality. ADRENAL GLANDS: No acute abnormality. KIDNEYS, URETERS AND BLADDER: No stones in the kidneys or ureters.  No hydronephrosis. No perinephric or periureteral stranding. Urinary bladder is unremarkable. GI AND BOWEL: Appendix normal. The stomach, small bowel, and large bowel are otherwise unremarkable. There is no bowel obstruction. PERITONEUM AND RETROPERITONEUM: No ascites. No free air. VASCULATURE: Aorta is normal in caliber. LYMPH NODES: No lymphadenopathy. REPRODUCTIVE ORGANS: No acute abnormality. BONES AND SOFT TISSUES: No acute osseous abnormality. No focal soft tissue abnormality. IMPRESSION: 1. No acute intra-abdominal or pelvic abnormality. 2. Normal noncontrast examination of the kidneys and renal collecting system. No hydronephrosis. No nephro or urolithiasis. Electronically  signed by: Dorethia Molt MD 07/05/2024 02:52 AM EST RP Workstation: [id number]     Procedures   Medications Ordered in the ED  hyoscyamine  (LEVSIN  SL) SL tablet 0.25 mg (0.25 mg Sublingual Given by Other 07/05/24 0036)                                    Medical Decision Making Amount and/or Complexity of Data Reviewed Labs: ordered. Decision-making details documented in ED Course. Radiology: ordered and independent interpretation performed. Decision-making details documented in ED Course.  Risk Prescription drug management.   Differential Diagnosis considered includes, but not limited to: Appendicitis; colitis; diverticulitis; bowel obstruction; hernia; cystitis; nephrolithiasis; pyelonephritis.  Patient presents to the emergency department for evaluation of abdominal pain.  Patient has been experiencing diarrhea since he was diagnosed with flu.  Abdominal exam was fairly benign.  Cramping likely secondary to diarrhea, however, he was noted to have microscopic hematuria on urinalysis without signs of infection.  CT renal study was performed, no acute pathology.  Will refer to urology for follow-up to ensure that this resolves.  Otherwise no acute findings that would require specific treatment.     Final  diagnoses:  Lower abdominal pain  Asymptomatic microscopic hematuria  Diarrhea of infectious origin    ED Discharge Orders          Ordered    hyoscyamine  (LEVSIN  SL) 0.125 MG SL tablet  Every 4 hours PRN        07/05/24 0257               Haze Lonni PARAS, MD 07/05/24 [phone]  "
